# Patient Record
Sex: Male | Born: 1945 | Race: White | Hispanic: No | Marital: Married | Smoking: Current some day smoker
Health system: Southern US, Community
[De-identification: ages and names within clinical notes are randomized; demographics above are authoritative.]

## PROBLEM LIST (undated history)

## (undated) DIAGNOSIS — J449 Chronic obstructive pulmonary disease, unspecified: Secondary | ICD-10-CM

## (undated) DIAGNOSIS — I10 Essential (primary) hypertension: Secondary | ICD-10-CM

## (undated) DIAGNOSIS — I219 Acute myocardial infarction, unspecified: Secondary | ICD-10-CM

## (undated) DIAGNOSIS — E785 Hyperlipidemia, unspecified: Secondary | ICD-10-CM

## (undated) DIAGNOSIS — R55 Syncope and collapse: Secondary | ICD-10-CM

## (undated) DIAGNOSIS — I872 Venous insufficiency (chronic) (peripheral): Secondary | ICD-10-CM

## (undated) DIAGNOSIS — I7090 Unspecified atherosclerosis: Secondary | ICD-10-CM

## (undated) DIAGNOSIS — C801 Malignant (primary) neoplasm, unspecified: Secondary | ICD-10-CM

## (undated) DIAGNOSIS — E039 Hypothyroidism, unspecified: Secondary | ICD-10-CM

## (undated) DIAGNOSIS — I509 Heart failure, unspecified: Secondary | ICD-10-CM

## (undated) DIAGNOSIS — I779 Disorder of arteries and arterioles, unspecified: Secondary | ICD-10-CM

## (undated) DIAGNOSIS — I739 Peripheral vascular disease, unspecified: Secondary | ICD-10-CM

## (undated) HISTORY — PX: CARPAL TUNNEL RELEASE: SHX101

## (undated) HISTORY — PX: CAROTID ENDARTERECTOMY: SUR193

## (undated) HISTORY — PX: APPENDECTOMY: SHX54

## (undated) HISTORY — PX: OTHER SURGICAL HISTORY: SHX169

---

## 2004-08-01 ENCOUNTER — Ambulatory Visit: Payer: Self-pay | Admitting: Internal Medicine

## 2006-05-17 ENCOUNTER — Ambulatory Visit: Payer: Self-pay | Admitting: Internal Medicine

## 2007-05-22 ENCOUNTER — Ambulatory Visit: Payer: Self-pay | Admitting: Internal Medicine

## 2007-10-11 ENCOUNTER — Other Ambulatory Visit: Payer: Self-pay

## 2007-10-11 ENCOUNTER — Emergency Department: Payer: Self-pay | Admitting: Emergency Medicine

## 2007-10-13 ENCOUNTER — Other Ambulatory Visit: Payer: Self-pay

## 2007-10-13 ENCOUNTER — Inpatient Hospital Stay: Payer: Self-pay | Admitting: Internal Medicine

## 2009-11-14 ENCOUNTER — Ambulatory Visit: Payer: Self-pay | Admitting: Internal Medicine

## 2010-08-07 ENCOUNTER — Ambulatory Visit: Payer: Self-pay | Admitting: Vascular Surgery

## 2013-06-23 ENCOUNTER — Ambulatory Visit: Payer: Self-pay | Admitting: Orthopedic Surgery

## 2013-08-10 ENCOUNTER — Ambulatory Visit: Payer: Self-pay | Admitting: Orthopedic Surgery

## 2013-08-10 LAB — CBC WITH DIFFERENTIAL/PLATELET
BASOS ABS: 0 10*3/uL (ref 0.0–0.1)
Basophil %: 0.5 %
Eosinophil #: 0.1 10*3/uL (ref 0.0–0.7)
Eosinophil %: 3.1 %
HCT: 41 % (ref 40.0–52.0)
HGB: 13.9 g/dL (ref 13.0–18.0)
Lymphocyte #: 0.8 10*3/uL — ABNORMAL LOW (ref 1.0–3.6)
Lymphocyte %: 17.9 %
MCH: 29.4 pg (ref 26.0–34.0)
MCHC: 34 g/dL (ref 32.0–36.0)
MCV: 86 fL (ref 80–100)
MONOS PCT: 16.6 %
Monocyte #: 0.8 x10 3/mm (ref 0.2–1.0)
Neutrophil #: 2.8 10*3/uL (ref 1.4–6.5)
Neutrophil %: 61.9 %
Platelet: 176 10*3/uL (ref 150–440)
RBC: 4.74 10*6/uL (ref 4.40–5.90)
RDW: 14.2 % (ref 11.5–14.5)
WBC: 4.6 10*3/uL (ref 3.8–10.6)

## 2013-08-20 ENCOUNTER — Ambulatory Visit: Payer: Self-pay | Admitting: Orthopedic Surgery

## 2014-01-29 ENCOUNTER — Ambulatory Visit: Payer: Self-pay | Admitting: Vascular Surgery

## 2014-02-03 ENCOUNTER — Ambulatory Visit: Payer: Self-pay | Admitting: Vascular Surgery

## 2014-02-03 LAB — CBC
HCT: 40.9 % (ref 40.0–52.0)
HGB: 13.2 g/dL (ref 13.0–18.0)
MCH: 28.6 pg (ref 26.0–34.0)
MCHC: 32.2 g/dL (ref 32.0–36.0)
MCV: 89 fL (ref 80–100)
Platelet: 159 10*3/uL (ref 150–440)
RBC: 4.62 10*6/uL (ref 4.40–5.90)
RDW: 14.5 % (ref 11.5–14.5)
WBC: 4.4 10*3/uL (ref 3.8–10.6)

## 2014-02-03 LAB — PROTIME-INR
INR: 1
Prothrombin Time: 12.9 secs (ref 11.5–14.7)

## 2014-02-03 LAB — APTT: ACTIVATED PTT: 31.9 s (ref 23.6–35.9)

## 2014-02-03 LAB — BASIC METABOLIC PANEL
Anion Gap: 6 — ABNORMAL LOW (ref 7–16)
BUN: 19 mg/dL — AB (ref 7–18)
CALCIUM: 8.8 mg/dL (ref 8.5–10.1)
Chloride: 101 mmol/L (ref 98–107)
Co2: 32 mmol/L (ref 21–32)
Creatinine: 1.32 mg/dL — ABNORMAL HIGH (ref 0.60–1.30)
EGFR (Non-African Amer.): 57 — ABNORMAL LOW
GLUCOSE: 90 mg/dL (ref 65–99)
Osmolality: 279 (ref 275–301)
Potassium: 4.1 mmol/L (ref 3.5–5.1)
SODIUM: 139 mmol/L (ref 136–145)

## 2014-02-05 ENCOUNTER — Inpatient Hospital Stay: Payer: Self-pay | Admitting: Vascular Surgery

## 2014-02-06 LAB — BASIC METABOLIC PANEL
Anion Gap: 9 (ref 7–16)
BUN: 22 mg/dL — AB (ref 7–18)
CALCIUM: 7.8 mg/dL — AB (ref 8.5–10.1)
CO2: 27 mmol/L (ref 21–32)
Chloride: 99 mmol/L (ref 98–107)
Creatinine: 1.12 mg/dL (ref 0.60–1.30)
EGFR (African American): >60
EGFR (Non-African Amer.): >60
Glucose: 111 mg/dL — ABNORMAL HIGH (ref 65–99)
Osmolality: 274 (ref 275–301)
Potassium: 4.1 mmol/L (ref 3.5–5.1)
Sodium: 135 mmol/L — ABNORMAL LOW (ref 136–145)

## 2014-02-06 LAB — CBC WITH DIFFERENTIAL/PLATELET
Basophil #: 0 10*3/uL (ref 0.0–0.1)
Basophil %: 0.5 %
Eosinophil #: 0 10*3/uL (ref 0.0–0.7)
Eosinophil %: 0 %
HCT: 33.4 % — AB (ref 40.0–52.0)
HGB: 11.2 g/dL — ABNORMAL LOW (ref 13.0–18.0)
Lymphocyte #: 0.6 10*3/uL — ABNORMAL LOW (ref 1.0–3.6)
Lymphocyte %: 9.9 %
MCH: 29.2 pg (ref 26.0–34.0)
MCHC: 33.6 g/dL (ref 32.0–36.0)
MCV: 87 fL (ref 80–100)
Monocyte #: 0.4 x10 3/mm (ref 0.2–1.0)
Monocyte %: 7.5 %
NEUTROS PCT: 82.1 %
Neutrophil #: 4.7 10*3/uL (ref 1.4–6.5)
Platelet: 126 10*3/uL — ABNORMAL LOW (ref 150–440)
RBC: 3.84 10*6/uL — AB (ref 4.40–5.90)
RDW: 14 % (ref 11.5–14.5)
WBC: 5.7 10*3/uL (ref 3.8–10.6)

## 2014-02-06 LAB — PROTIME-INR
INR: 1
Prothrombin Time: 13.5 secs (ref 11.5–14.7)

## 2014-02-06 LAB — APTT: ACTIVATED PTT: 33.2 s (ref 23.6–35.9)

## 2014-02-08 LAB — PATHOLOGY REPORT

## 2014-03-22 ENCOUNTER — Ambulatory Visit: Payer: Self-pay | Admitting: Vascular Surgery

## 2014-03-22 LAB — POTASSIUM: POTASSIUM: 4 mmol/L (ref 3.5–5.1)

## 2014-03-25 ENCOUNTER — Inpatient Hospital Stay: Payer: Self-pay | Admitting: Vascular Surgery

## 2014-03-26 LAB — BASIC METABOLIC PANEL
Anion Gap: 7 (ref 7–16)
BUN: 24 mg/dL — ABNORMAL HIGH (ref 7–18)
Calcium, Total: 8.1 mg/dL — ABNORMAL LOW (ref 8.5–10.1)
Chloride: 102 mmol/L (ref 98–107)
Co2: 30 mmol/L (ref 21–32)
Creatinine: 1.3 mg/dL (ref 0.60–1.30)
EGFR (African American): >60
EGFR (Non-African Amer.): 58 — ABNORMAL LOW
Glucose: 85 mg/dL (ref 65–99)
Osmolality: 281 (ref 275–301)
Potassium: 4.5 mmol/L (ref 3.5–5.1)
Sodium: 139 mmol/L (ref 136–145)

## 2014-03-26 LAB — CBC WITH DIFFERENTIAL/PLATELET
Basophil #: 0 10*3/uL (ref 0.0–0.1)
Basophil %: 0.4 %
EOS PCT: 1.1 %
Eosinophil #: 0.1 10*3/uL (ref 0.0–0.7)
HCT: 37.2 % — ABNORMAL LOW (ref 40.0–52.0)
HGB: 12 g/dL — ABNORMAL LOW (ref 13.0–18.0)
LYMPHS PCT: 10.2 %
Lymphocyte #: 0.5 10*3/uL — ABNORMAL LOW (ref 1.0–3.6)
MCH: 28.7 pg (ref 26.0–34.0)
MCHC: 32.3 g/dL (ref 32.0–36.0)
MCV: 89 fL (ref 80–100)
MONOS PCT: 12.2 %
Monocyte #: 0.6 x10 3/mm (ref 0.2–1.0)
NEUTROS ABS: 3.9 10*3/uL (ref 1.4–6.5)
Neutrophil %: 76.1 %
Platelet: 108 10*3/uL — ABNORMAL LOW (ref 150–440)
RBC: 4.19 10*6/uL — ABNORMAL LOW (ref 4.40–5.90)
RDW: 14.3 % (ref 11.5–14.5)
WBC: 5.2 10*3/uL (ref 3.8–10.6)

## 2014-03-26 LAB — PROTIME-INR
INR: 1.1
PROTHROMBIN TIME: 13.6 s (ref 11.5–14.7)

## 2014-03-26 LAB — APTT: Activated PTT: 35.4 secs (ref 23.6–35.9)

## 2014-08-14 NOTE — Op Note (Signed)
PATIENT NAME:  Aaron Bryan, Aaron Bryan MR#:  [id number] DATE OF BIRTH:  Aug 11, 1945  DATE OF PROCEDURE:  08/20/2013  PREOPERATIVE DIAGNOSIS: Left carpal tunnel syndrome.   POSTOPERATIVE DIAGNOSIS:  Left carpal tunnel syndrome.  PROCEDURE: Left open carpal tunnel release.   SURGEON: Thornton Park, MD  ANESTHESIA: General.   ESTIMATED BLOOD LOSS: Minimal.   COMPLICATIONS: None.  TOURNIQUET TIME: 46 minutes.   INDICATIONS FOR PROCEDURE: [name] is a 70 year old male who has successfully undergone a right open carpal tunnel release. He has had bilateral carpal tunnel syndrome for several months. His pain and numbness have been confirmed as carpal tunnel by a nerve conduction study performed in my office. Given the patient's success with the right hand, he wished to proceed with open carpal tunnel release on the left side. I had reviewed the risks and benefits again with the patient for this procedure. He is aware of I had given, that he has already been through this surgery previously.   PROCEDURE NOTE: The patient was in the preoperative area. I signed the left hand with the word "yes" according to the hospital's right site protocol. He was brought to the operating room where he was placed supine on the operative table. He was prepped and draped in sterile fashion. A timeout was performed to verify the patient's name, date of birth, medical record number, correct site of surgery and correct procedure to be performed. It was also used to verify the patient had received the antibiotics and all appropriate instruments and radiographic studies were available in the room. Once all in attendance were in agreement, the case began.   The patient had his left arm wrapped in an Esmarch. The tourniquet was inflated to 250 mmHg. His surgical incision was drawn out with a surgical marker. This was based on the extension of Kaplan's cardinal line and the extension of the line between the ring and middle finger. I  used the patient's natural creases when creating the incision. A #15 blade was used to create the incision. The subcutaneous tissues were carefully dissected using an Adson pick-up and Metzenbaum scissor. The polar fascia was then identified again carefully dissected. The distal aspect of the transverse carpal ligament was then identified. A Freer elevator was carefully placed underneath the transverse carpal ligament and advanced proximally into the carpal tunnel. A micro Beaver blade was then used to incise the transverse carpal ligament. Loupes were used for this case through assistant visualization. Care was taken to watch for the motor branch of the median nerve which was not encountered during the creation of the incision to the transverse carpal ligament. Once the release of the transverse carpal ligament was extended approximately 1 to 2 cm into the deep fascia of the forearm, as this was also very tight and thought to be contributing to the compression of the median nerve. Care was taken to avoid injury to the sensory branch of the median nerve.   Once the carpal tunnel was released and the median nerve was well-visualized, the wound was copiously irrigated. The incision was closed with 4-0 nylon in an interrupted fashion. Steri-Strips and Xeroform were placed over the incision, along with a dry sterile dressing and a volar wrist splint made out of Orthoglass. The bias cut was placed overlying this dressing. The patient was then awakened and brought to the PACU in stable condition. I was scrubbed and present for the entire case and all sharp and instrument counts were correct at the conclusion of the  case. I spoke with the patient's wife in the postoperative consultation room to let her know the case had gone without complication. The patient was stable in the recovery room.   ____________________________ Timoteo Gaul, MD klk:ce D: 08/24/2013 18:49:47 ET T: 08/24/2013 19:15:52  ET JOB#: [id number]  cc: Timoteo Gaul, MD, <Dictator> Timoteo Gaul MD ELECTRONICALLY SIGNED 08/28/2013 21:11

## 2014-08-14 NOTE — Op Note (Signed)
PATIENT NAME:  Aaron Bryan, Aaron Bryan MR#:  [id number] DATE OF BIRTH:  09/17/1945  DATE OF PROCEDURE:  02/05/2014  PREOPERATIVE DIAGNOSES: 1.  High-grade bilateral carotid artery stenosis with lightheadedness.  2.  Coronary artery disease.  3.  Peripheral arterial disease status post previous intervention.   POSTOPERATIVE DIAGNOSES:  1.  High-grade bilateral carotid artery stenosis with lightheadedness.  2.  Coronary artery disease.  3.  Peripheral arterial disease status post previous intervention.   PROCEDURE: Left carotid endarterectomy with CorMatrix arterial patch reconstruction.   SURGEON: Algernon Huxley, M.D.   ANESTHESIA: General.   BLOOD LOSS: 50 mL.   INDICATION FOR PROCEDURE: A 69 year old gentleman with recent finding of bilateral high-grade carotid artery stenosis. We are planning to repair his left side first as this is his dominant hemisphere and later come back and do the right. Risks and benefits were discussed. Informed consent was obtained.   DESCRIPTION OF PROCEDURE: The patient was brought to the operative suite. After an adequate level of general anesthesia was obtained, he was placed in modified beach chair position. His neck was flexed and turned to the side. His neck was sterilely prepped and draped and a sterile surgical field was created. An incision was created along the anterior border of the sternocleidomastoid dissected through the platysma with electrocautery. I then exposed the facial vein, which was ligated and divided between silk ties exposing the carotid bifurcation. He had a reasonably high bifurcation with extent of disease a couple centimeters beyond the origin of the internal carotid artery. I had to dissect out and free the hypoglossal nerve with ligating or dividing the branches around this to help expose this. I was then able to encircle the distal internal carotid artery, external carotid artery, superior thyroid artery and common carotid artery with vessel  loops. The patient was heparinized with 6000 units of intravenous heparin and control was pulled up on the vessel loops. An anterior wall arteriotomy was created with an 11 blade and extended with Potts scissors. The Pruitt-Inahara shunt was placed first and the internal carotid artery flushed and de-aired, then in the common carotid artery. Approximately 2 minutes elapsed from clamping to shunt placement. An endarterectomy was then performed in typical fashion. The proximal endpoint was cut flush with tenotomy scissors. An eversion endarterectomy was performed on the external carotid artery and a nice feathered endpoint was created with gentle traction on the internal carotid artery. All loose flecks were removed and the vessel was locally heparinized. The CorMatrix extracellular patch was brought onto the field. It was cut and beveled, starting at the distal endpoint with 6-0 Prolene, and this was run medially and laterally one-half the length of the arteriotomy. It was then cut and beveled to an appropriate length to match the arteriotomy. The medial suture line was run and tied together. The lateral suture line was run approximately one-quarter the length of the arteriotomy. The shunt was then removed. The vessel was flushed from the internal, external and common carotid artery and locally heparinized. The suture line was then completed flushing several cardiac cycles up the external carotid artery prior to releasing control. Approximately 3 minutes elapsed from shunt removal to restoration of flow. Three 6-0 Prolene patch sutures were used for hemostasis. Hemostasis was complete with an excellent pulse in the carotid artery. Surgicel and Evicel topical hemostatic agents were placed and hemostasis was achieved. The wound was then closed with three interrupted 3-0 Vicryl sutures in the sternocleidomastoid. The platysma was closed with a  running 3-0 Vicryl, and the skin was closed with 4-0 Monocryl. Dermabond was  placed as dressing. The patient was awakened from anesthesia and taken to the recovery room in stable condition having tolerated the procedure well.   ____________________________ Algernon Huxley, MD jsd:sb D: 02/05/2014 12:05:12 ET T: 02/05/2014 12:17:15 ET JOB#: [id number]  cc: Algernon Huxley, MD, <Dictator> Algernon Huxley MD ELECTRONICALLY SIGNED 02/06/2014 12:39

## 2014-08-14 NOTE — Op Note (Signed)
PATIENT NAME:  Aaron Bryan, Aaron Bryan MR#:  [id number] DATE OF BIRTH:  [birth year]  DATE OF PROCEDURE:  03/25/2014  PREOPERATIVE DIAGNOSES:  1.  High-grade right carotid artery stenosis.  2.  Status post left carotid endarterectomy for high-grade stenosis.  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  4.  Peripheral arterial disease.   POSTOPERATIVE DIAGNOSES: 1.  High-grade right carotid artery stenosis.  2.  Status post left carotid endarterectomy for high-grade stenosis.  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  4.  Peripheral arterial disease.  PROCEDURE:  Right carotid endarterectomy with CorMatrix arterial patch reconstruction.   SURGEON: Leotis Pain, MD   ANESTHESIA: General.   ESTIMATED BLOOD LOSS: 150 mL.  INDICATION FOR PROCEDURE: A 69 year old gentleman who was found A few months ago to have A bilateral high-grade carotid artery stenosis. He has already undergone left carotid endarterectomy and has done well. This was done a little under 2 months ago. He returns for surgery on his right carotid artery. Risks and benefits were discussed. Informed consent was obtained.   DESCRIPTION OF PROCEDURE: The patient is brought to the operative suite and after an adequate level of general anesthesia was obtained, he was placed in modified beach chair position. His head was flexed and turned to the left and a roll was placed under shoulders. His neck and chest were sterilely prepped and draped and a sterile surgical field was created. I then created an incision along the anterior border of the sternocleidomastoid and dissected down through the platysma with electrocautery. A crossing vein was ligated and divided between silk ties. There was not a dominant facial vein but 2 venous branches were ligated and divided between silk ties. This exposed the carotid artery. The bifurcation was a reasonably normal location. However, the disease tracked about 2 cm above the bifurcation and care had to be taken to dissect  out the carotid artery distally, avoiding injury to the hypoglossal nerve. A venous branch up in this area was ligated and divided between silk ties. The internal carotid artery distal to the lesion and common carotid artery, superior thyroid artery and external carotid artery were all encircled with vessel loops. The patient was heparinized with 5000 units of intravenous heparin and this was allowed to circulate for 5 minutes. Control was then pulled up on the vessel loops. An anterior wall arteriotomy was created with an 11 blade and extended with Potts scissors. The Pruitt-Inahara shunt was then placed, first in the internal carotid artery, flushed and de-aired, then the common carotid artery. Approximate time from clamping to restoration of flow was 2 minutes. I then performed endarterectomy in the typical fashion. An eversion endarterectomy was performed on the external carotid artery. The proximal endpoint was cut flush with tenotomy scissors. A nice feathered distal endpoint was created with gentle traction at the distal endpoint. Two 7-0 Prolene were used to tack down the distal endpoint. All loose flecks were removed and the vessel was locally heparinized. I then used a CorMatrix arterial patch to reconstruct the artery. This was cut and beveled and started at the distal endpoint with 6-0 Prolene and run approximately one-half the length of the arteriotomy medially and laterally. It was then cut and beveled to match the arteriotomy proximally and a second 6-0 Prolene was started at the proximal end of the arteriotomy. This was run medially and tied. The lateral suture line was run approximately one-quarter length of the arteriotomy and the shunt was then removed. The vessel was  flushed from the external, internal and common carotid arteries and locally heparinized. I then completed the arteriotomy, flushing through the external carotid artery prior to releasing control several cardiac cycles. Approximately  2 to 3 minutes elapsed from shunt removal to restoration of flow. Four 6-0 Prolene patch sutures were used for hemostasis and hemostasis was achieved. The wound was irrigated with antibiotic-impregnated saline. Surgicel and Evicel were placed. The wound was then closed with 3 interrupted 3-0 Vicryls in the sternocleidomastoid space. The platysma was closed with a running 3-0 Vicryl. The skin was closed with 4-0 Monocryl and Dermabond was placed as a dressing. The patient was extubated and awakened and taken to the recovery room in stable condition, having tolerated the procedure well.   ____________________________ Algernon Huxley, MD jsd:TT D: 03/25/2014 17:44:31 ET T: 03/25/2014 18:53:22 ET JOB#: [id number]  cc: Algernon Huxley, MD, <Dictator> Derinda Late, MD Algernon Huxley MD ELECTRONICALLY SIGNED 04/22/2014 14:13

## 2014-08-14 NOTE — Op Note (Signed)
PATIENT NAME:  Aaron Bryan, Aaron Bryan MR#:  [id number] DATE OF BIRTH:  [birth year]  DATE OF PROCEDURE:  06/23/2013  PREOPERATIVE DIAGNOSIS:  Right carpal tunnel syndrome.  POSTOPERATIVE DIAGNOSIS:  Right carpal tunnel syndrome.   PROCEDURE:  Open carpal tunnel release.   ANESTHESIA:  General.   SURGEON:  Thornton Park, M.D.   ESTIMATED BLOOD LOSS:  Minimal.   COMPLICATIONS:  None.   TOURNIQUET TIME:  38 minutes.   INDICATIONS FOR THE PROCEDURE:  The patient is a 69 year old male with severe pain with associated numbness and weakness of the right hand.  He had a nerve conduction study performed in my office confirming carpal tunnel syndrome.  Given the patient's symptoms, he wished to proceed with open carpal tunnel release.  I had reviewed the risks and benefits of the procedure with the patient and he signed consent.  He signed the consent form.  He understands the risks include bleeding, nerve injury including injury to the recurrent branch of the median nerve, persistent pain, wound healing difficulty, persistent pillar pain in the volar wrist, wrist stiffness, the recurrence of carpal tunnel syndrome and the need for further surgery.  He understood the medical risks include myocardial infarction, stroke, pneumonia, respiratory failure and death.  The patient had received medical clearance prior to the case.   PROCEDURE:  The patient was brought to the Operating Room where he was placed supine on the operative table.  He was prepped and draped in a sterile fashion.  A timeout was performed to verify the patient's name, date of birth, medical record number, correct site of surgery and correct procedure to be performed.  It was also used to verify the patient had received antibiotics and all appropriate instruments, implants, and radiographic studies were available in the room.  Once all in attendance, were in agreement, the case began.   The patient had the proposed incision drawn out with a  surgical marker based upon Kaplin's cardinal line and the line extending proximally from the webspace between the middle and ring fingers.  The patient's Palmer crease was also incorporated into the incision.  The patient's arm was then exsanguinated.  The tourniquet was elevated to 250 mmHg and inflated for a total of 30 minutes.  The skin incision was then made with a 15 blade.  The subcutaneous tissues were carefully dissected with a Metzenbaum scissor and pick-up.  The distal edge of the transverse carpal ligament was then identified.  A Freer elevator was placed under the transverse carpal ligament and advanced carefully proximally under the transverse carpal ligament.  A micro Beaver blade was then used to carefully incise the palmar fascia and the transverse carpal ligament, taking care to watch for the potential intraligamentous recurrent motor branch of the median nerve.  Once the entire transverse ligament was released, the incision was extended by approximately 5 mm to allow for slight release of the forearm fascia which was also causing compression to the median nerve.  The nerve appeared to be healthy and well-vascularized.  The wound was then copiously irrigated.  The skin was approximated with 4-0 nylon.  Dry sterile dressing was then applied including Xeroform over the incision.  The patient had a 3 x 12 volar splint applied to the arm to help to avoid wrist flexion and extension.  This was overwrapped with a bias bandage.  The patient was then awoken and brought to the PACU in stable condition.  I was scrubbed and present for the entire case and  all sharp and instrument counts were correct at the conclusion of the case.  I spoke with the patient's wife in the postoperative consultation room to let her know that her husband was stable in the recovery room and the case had gone without complication.     ____________________________ Timoteo Gaul, MD klk:ea D: 06/24/2013 23:05:56  ET T: 06/24/2013 23:39:48 ET JOB#: [id number]  cc: Timoteo Gaul, MD, <Dictator> Timoteo Gaul MD ELECTRONICALLY SIGNED 06/25/2013 22:54

## 2014-08-16 LAB — SURGICAL PATHOLOGY

## 2014-11-09 ENCOUNTER — Other Ambulatory Visit: Payer: Self-pay | Admitting: Internal Medicine

## 2014-11-09 DIAGNOSIS — Z720 Tobacco use: Secondary | ICD-10-CM

## 2014-11-09 DIAGNOSIS — R634 Abnormal weight loss: Secondary | ICD-10-CM

## 2014-11-09 DIAGNOSIS — C493 Malignant neoplasm of connective and soft tissue of thorax: Secondary | ICD-10-CM

## 2014-11-16 ENCOUNTER — Ambulatory Visit
Admission: RE | Admit: 2014-11-16 | Discharge: 2014-11-16 | Disposition: A | Discharge status: Home / Self Care | Payer: Medicare Other | Source: Ambulatory Visit | Attending: Internal Medicine | Admitting: Internal Medicine

## 2014-11-16 DIAGNOSIS — Z720 Tobacco use: Secondary | ICD-10-CM | POA: Insufficient documentation

## 2014-11-16 DIAGNOSIS — M4804 Spinal stenosis, thoracic region: Secondary | ICD-10-CM | POA: Diagnosis not present

## 2014-11-16 DIAGNOSIS — R911 Solitary pulmonary nodule: Secondary | ICD-10-CM | POA: Insufficient documentation

## 2014-11-16 DIAGNOSIS — C493 Malignant neoplasm of connective and soft tissue of thorax: Secondary | ICD-10-CM

## 2014-11-16 DIAGNOSIS — R590 Localized enlarged lymph nodes: Secondary | ICD-10-CM | POA: Diagnosis not present

## 2014-11-16 DIAGNOSIS — R634 Abnormal weight loss: Secondary | ICD-10-CM

## 2014-11-16 DIAGNOSIS — Z85831 Personal history of malignant neoplasm of soft tissue: Secondary | ICD-10-CM | POA: Insufficient documentation

## 2014-11-16 HISTORY — DX: Essential (primary) hypertension: I10

## 2014-11-16 HISTORY — DX: Malignant (primary) neoplasm, unspecified: C80.1

## 2014-11-16 MED ORDER — IOHEXOL 300 MG/ML  SOLN
75.0000 mL | Freq: Once | INTRAMUSCULAR | Status: AC | PRN
Start: 2014-11-16 — End: 2014-11-16
  Administered 2014-11-16: 75 mL via INTRAVENOUS

## 2014-11-18 ENCOUNTER — Other Ambulatory Visit: Payer: Self-pay | Admitting: Internal Medicine

## 2014-11-18 DIAGNOSIS — R9389 Abnormal findings on diagnostic imaging of other specified body structures: Secondary | ICD-10-CM

## 2014-11-23 ENCOUNTER — Ambulatory Visit
Admission: RE | Admit: 2014-11-23 | Discharge: 2014-11-23 | Disposition: A | Discharge status: Home / Self Care | Payer: Medicare Other | Source: Ambulatory Visit | Attending: Internal Medicine | Admitting: Internal Medicine

## 2014-11-23 DIAGNOSIS — I708 Atherosclerosis of other arteries: Secondary | ICD-10-CM | POA: Diagnosis not present

## 2014-11-23 DIAGNOSIS — R938 Abnormal findings on diagnostic imaging of other specified body structures: Secondary | ICD-10-CM | POA: Diagnosis present

## 2014-11-23 DIAGNOSIS — I714 Abdominal aortic aneurysm, without rupture: Secondary | ICD-10-CM | POA: Diagnosis not present

## 2014-11-23 DIAGNOSIS — R9389 Abnormal findings on diagnostic imaging of other specified body structures: Secondary | ICD-10-CM

## 2014-11-23 MED ORDER — IOHEXOL 300 MG/ML  SOLN
100.0000 mL | Freq: Once | INTRAMUSCULAR | Status: AC | PRN
  Administered 2014-11-23: 100 mL via INTRAVENOUS

## 2014-11-25 ENCOUNTER — Other Ambulatory Visit: Payer: Self-pay | Admitting: Internal Medicine

## 2014-11-25 DIAGNOSIS — R590 Localized enlarged lymph nodes: Secondary | ICD-10-CM

## 2015-03-31 ENCOUNTER — Ambulatory Visit
Admission: RE | Admit: 2015-03-31 | Discharge: 2015-03-31 | Disposition: A | Discharge status: Home / Self Care | Payer: Medicare Other | Source: Ambulatory Visit | Attending: Internal Medicine | Admitting: Internal Medicine

## 2015-03-31 DIAGNOSIS — R590 Localized enlarged lymph nodes: Secondary | ICD-10-CM

## 2015-03-31 DIAGNOSIS — Z9889 Other specified postprocedural states: Secondary | ICD-10-CM | POA: Diagnosis not present

## 2015-03-31 DIAGNOSIS — R599 Enlarged lymph nodes, unspecified: Secondary | ICD-10-CM | POA: Insufficient documentation

## 2015-03-31 DIAGNOSIS — I714 Abdominal aortic aneurysm, without rupture: Secondary | ICD-10-CM | POA: Diagnosis not present

## 2015-03-31 HISTORY — DX: Heart failure, unspecified: I50.9

## 2015-03-31 MED ORDER — IOHEXOL 300 MG/ML  SOLN
100.0000 mL | Freq: Once | INTRAMUSCULAR | Status: AC | PRN
  Administered 2015-03-31: 100 mL via INTRAVENOUS

## 2015-04-20 ENCOUNTER — Other Ambulatory Visit: Payer: Self-pay | Admitting: Internal Medicine

## 2015-04-20 DIAGNOSIS — R270 Ataxia, unspecified: Secondary | ICD-10-CM

## 2015-04-22 ENCOUNTER — Other Ambulatory Visit: Payer: Self-pay | Admitting: Internal Medicine

## 2015-04-22 DIAGNOSIS — R1310 Dysphagia, unspecified: Secondary | ICD-10-CM

## 2015-04-26 ENCOUNTER — Emergency Department: Payer: Medicare Other

## 2015-04-26 ENCOUNTER — Ambulatory Visit: Admission: RE | Admit: 2015-04-26 | Payer: Medicare Other | Source: Ambulatory Visit

## 2015-04-26 ENCOUNTER — Encounter: Payer: Self-pay | Admitting: Emergency Medicine

## 2015-04-26 ENCOUNTER — Other Ambulatory Visit: Payer: Self-pay

## 2015-04-26 ENCOUNTER — Observation Stay
Admission: EM | Admit: 2015-04-26 | Discharge: 2015-04-28 | Disposition: A | Discharge status: Home / Self Care | Payer: Medicare Other | Attending: Internal Medicine | Admitting: Internal Medicine

## 2015-04-26 DIAGNOSIS — R05 Cough: Secondary | ICD-10-CM | POA: Diagnosis not present

## 2015-04-26 DIAGNOSIS — I350 Nonrheumatic aortic (valve) stenosis: Secondary | ICD-10-CM | POA: Diagnosis not present

## 2015-04-26 DIAGNOSIS — R531 Weakness: Secondary | ICD-10-CM | POA: Insufficient documentation

## 2015-04-26 DIAGNOSIS — R7989 Other specified abnormal findings of blood chemistry: Secondary | ICD-10-CM | POA: Diagnosis present

## 2015-04-26 DIAGNOSIS — I509 Heart failure, unspecified: Secondary | ICD-10-CM

## 2015-04-26 DIAGNOSIS — I2489 Other forms of acute ischemic heart disease: Secondary | ICD-10-CM | POA: Diagnosis present

## 2015-04-26 DIAGNOSIS — F17210 Nicotine dependence, cigarettes, uncomplicated: Secondary | ICD-10-CM | POA: Insufficient documentation

## 2015-04-26 DIAGNOSIS — Z79899 Other long term (current) drug therapy: Secondary | ICD-10-CM | POA: Diagnosis not present

## 2015-04-26 DIAGNOSIS — R748 Abnormal levels of other serum enzymes: Secondary | ICD-10-CM | POA: Diagnosis not present

## 2015-04-26 DIAGNOSIS — I248 Other forms of acute ischemic heart disease: Secondary | ICD-10-CM | POA: Diagnosis present

## 2015-04-26 DIAGNOSIS — Z8249 Family history of ischemic heart disease and other diseases of the circulatory system: Secondary | ICD-10-CM | POA: Insufficient documentation

## 2015-04-26 DIAGNOSIS — E43 Unspecified severe protein-calorie malnutrition: Secondary | ICD-10-CM | POA: Insufficient documentation

## 2015-04-26 DIAGNOSIS — R0602 Shortness of breath: Secondary | ICD-10-CM | POA: Diagnosis not present

## 2015-04-26 DIAGNOSIS — Z7951 Long term (current) use of inhaled steroids: Secondary | ICD-10-CM | POA: Insufficient documentation

## 2015-04-26 DIAGNOSIS — Z923 Personal history of irradiation: Secondary | ICD-10-CM | POA: Diagnosis not present

## 2015-04-26 DIAGNOSIS — C493 Malignant neoplasm of connective and soft tissue of thorax: Secondary | ICD-10-CM | POA: Diagnosis not present

## 2015-04-26 DIAGNOSIS — E039 Hypothyroidism, unspecified: Secondary | ICD-10-CM | POA: Insufficient documentation

## 2015-04-26 DIAGNOSIS — R778 Other specified abnormalities of plasma proteins: Secondary | ICD-10-CM

## 2015-04-26 DIAGNOSIS — J441 Chronic obstructive pulmonary disease with (acute) exacerbation: Secondary | ICD-10-CM | POA: Diagnosis not present

## 2015-04-26 DIAGNOSIS — C3490 Malignant neoplasm of unspecified part of unspecified bronchus or lung: Secondary | ICD-10-CM | POA: Diagnosis not present

## 2015-04-26 DIAGNOSIS — J069 Acute upper respiratory infection, unspecified: Secondary | ICD-10-CM | POA: Insufficient documentation

## 2015-04-26 DIAGNOSIS — R079 Chest pain, unspecified: Secondary | ICD-10-CM | POA: Diagnosis not present

## 2015-04-26 DIAGNOSIS — I10 Essential (primary) hypertension: Secondary | ICD-10-CM | POA: Insufficient documentation

## 2015-04-26 DIAGNOSIS — L8990 Pressure ulcer of unspecified site, unspecified stage: Secondary | ICD-10-CM | POA: Diagnosis present

## 2015-04-26 HISTORY — DX: Chronic obstructive pulmonary disease, unspecified: J44.9

## 2015-04-26 LAB — CARBOXYHEMOGLOBIN
Carboxyhemoglobin: 5.6 % (ref 1.5–9.0)
Methemoglobin: 0.8 %
O2 SAT: 89 %

## 2015-04-26 LAB — BRAIN NATRIURETIC PEPTIDE: B NATRIURETIC PEPTIDE 5: 2535 pg/mL — AB (ref 0.0–100.0)

## 2015-04-26 LAB — COMPREHENSIVE METABOLIC PANEL
ALT: 23 U/L (ref 17–63)
AST: 30 U/L (ref 15–41)
Albumin: 3.6 g/dL (ref 3.5–5.0)
Alkaline Phosphatase: 74 U/L (ref 38–126)
Anion gap: 5 (ref 5–15)
BUN: 39 mg/dL — AB (ref 6–20)
CALCIUM: 9.4 mg/dL (ref 8.9–10.3)
CHLORIDE: 95 mmol/L — AB (ref 101–111)
CO2: 34 mmol/L — AB (ref 22–32)
CREATININE: 1.26 mg/dL — AB (ref 0.61–1.24)
GFR, EST NON AFRICAN AMERICAN: 56 mL/min — AB (ref >60)
Glucose, Bld: 84 mg/dL (ref 65–99)
Potassium: 4.3 mmol/L (ref 3.5–5.1)
Sodium: 134 mmol/L — ABNORMAL LOW (ref 135–145)
Total Bilirubin: 1.7 mg/dL — ABNORMAL HIGH (ref 0.3–1.2)
Total Protein: 7.2 g/dL (ref 6.5–8.1)

## 2015-04-26 LAB — CBC WITH DIFFERENTIAL/PLATELET
Basophils Absolute: 0 10*3/uL (ref 0–0.1)
EOS ABS: 0 10*3/uL (ref 0–0.7)
HCT: 52.4 % — ABNORMAL HIGH (ref 40.0–52.0)
Hemoglobin: 17.2 g/dL (ref 13.0–18.0)
LYMPHS ABS: 0.4 10*3/uL — AB (ref 1.0–3.6)
Lymphocytes Relative: 10 %
MCH: 29.1 pg (ref 26.0–34.0)
MCHC: 32.8 g/dL (ref 32.0–36.0)
MCV: 88.8 fL (ref 80.0–100.0)
Monocytes Absolute: 0.5 10*3/uL (ref 0.2–1.0)
Neutro Abs: 3.4 10*3/uL (ref 1.4–6.5)
Neutrophils Relative %: 79 %
PLATELETS: 84 10*3/uL — AB (ref 150–440)
RBC: 5.9 MIL/uL (ref 4.40–5.90)
RDW: 15.2 % — AB (ref 11.5–14.5)
WBC: 4.3 10*3/uL (ref 3.8–10.6)

## 2015-04-26 LAB — BLOOD GAS, ARTERIAL
ALLENS TEST (PASS/FAIL): POSITIVE — AB
Acid-Base Excess: 4.8 mmol/L — ABNORMAL HIGH (ref 0.0–3.0)
BICARBONATE: 30.4 meq/L — AB (ref 21.0–28.0)
FIO2: 21
O2 Saturation: 87.4 %
PATIENT TEMPERATURE: 37
PO2 ART: 53 mmHg — AB (ref 83.0–108.0)
pCO2 arterial: 48 mmHg (ref 32.0–48.0)
pH, Arterial: 7.41 (ref 7.350–7.450)

## 2015-04-26 LAB — TROPONIN I: TROPONIN I: 0.09 ng/mL — AB (ref <0.031)

## 2015-04-26 MED ORDER — OXYCODONE HCL 5 MG PO TABS: 5.0000 mg | ORAL_TABLET | ORAL | Status: DC | PRN

## 2015-04-26 MED ORDER — SODIUM CHLORIDE 0.9 % IJ SOLN
3.0000 mL | Freq: Two times a day (BID) | INTRAMUSCULAR | Status: DC
  Administered 2015-04-27 – 2015-04-28 (×4): 3 mL via INTRAVENOUS

## 2015-04-26 MED ORDER — ASPIRIN EC 325 MG PO TBEC: 325.0000 mg | DELAYED_RELEASE_TABLET | Freq: Once | ORAL | Status: AC

## 2015-04-26 MED ORDER — MOMETASONE FURO-FORMOTEROL FUM 200-5 MCG/ACT IN AERO
2.0000 | INHALATION_SPRAY | Freq: Two times a day (BID) | RESPIRATORY_TRACT | Status: DC
  Administered 2015-04-27 – 2015-04-28 (×3): 2 via RESPIRATORY_TRACT
  Filled 2015-04-26: qty 8.8

## 2015-04-26 MED ORDER — DEXTROSE 5 % IV SOLN
500.0000 mg | INTRAVENOUS | Status: DC
  Administered 2015-04-27 (×2): 500 mg via INTRAVENOUS
  Filled 2015-04-26 (×3): qty 500

## 2015-04-26 MED ORDER — MORPHINE SULFATE (PF) 2 MG/ML IV SOLN: 2.0000 mg | INTRAVENOUS | Status: DC | PRN

## 2015-04-26 MED ORDER — ACETAMINOPHEN 650 MG RE SUPP: 650.0000 mg | Freq: Four times a day (QID) | RECTAL | Status: DC | PRN

## 2015-04-26 MED ORDER — NITROGLYCERIN 2 % TD OINT
TOPICAL_OINTMENT | TRANSDERMAL | Status: AC
  Filled 2015-04-26: qty 1

## 2015-04-26 MED ORDER — AMLODIPINE BESYLATE 5 MG PO TABS
2.5000 mg | ORAL_TABLET | Freq: Every day | ORAL | Status: DC
  Administered 2015-04-27 – 2015-04-28 (×2): 2.5 mg via ORAL
  Filled 2015-04-26 (×2): qty 1

## 2015-04-26 MED ORDER — ATENOLOL 25 MG PO TABS
25.0000 mg | ORAL_TABLET | Freq: Two times a day (BID) | ORAL | Status: DC
  Administered 2015-04-27 – 2015-04-28 (×4): 25 mg via ORAL
  Filled 2015-04-26 (×4): qty 1

## 2015-04-26 MED ORDER — ONDANSETRON HCL 4 MG/2ML IJ SOLN: 4.0000 mg | Freq: Four times a day (QID) | INTRAMUSCULAR | Status: DC | PRN

## 2015-04-26 MED ORDER — ONDANSETRON HCL 4 MG PO TABS: 4.0000 mg | ORAL_TABLET | Freq: Four times a day (QID) | ORAL | Status: DC | PRN

## 2015-04-26 MED ORDER — IPRATROPIUM-ALBUTEROL 0.5-2.5 (3) MG/3ML IN SOLN: 3.0000 mL | RESPIRATORY_TRACT | Status: DC | PRN

## 2015-04-26 MED ORDER — BUPROPION HCL ER (SR) 150 MG PO TB12
150.0000 mg | ORAL_TABLET | Freq: Two times a day (BID) | ORAL | Status: DC
  Administered 2015-04-27 – 2015-04-28 (×4): 150 mg via ORAL
  Filled 2015-04-26 (×4): qty 1

## 2015-04-26 MED ORDER — HEPARIN SODIUM (PORCINE) 5000 UNIT/ML IJ SOLN
5000.0000 [IU] | Freq: Three times a day (TID) | INTRAMUSCULAR | Status: DC
  Administered 2015-04-27 – 2015-04-28 (×5): 5000 [IU] via SUBCUTANEOUS
  Filled 2015-04-26 (×5): qty 1

## 2015-04-26 MED ORDER — ACETAMINOPHEN 325 MG PO TABS: 650.0000 mg | ORAL_TABLET | Freq: Four times a day (QID) | ORAL | Status: DC | PRN

## 2015-04-26 MED ORDER — ASPIRIN 325 MG PO TABS
ORAL_TABLET | ORAL | Status: AC
  Administered 2015-04-26: 325 mg
  Filled 2015-04-26: qty 1

## 2015-04-26 MED ORDER — SERTRALINE HCL 100 MG PO TABS
100.0000 mg | ORAL_TABLET | Freq: Every day | ORAL | Status: DC
  Administered 2015-04-27 – 2015-04-28 (×2): 100 mg via ORAL
  Filled 2015-04-26 (×2): qty 1

## 2015-04-26 MED ORDER — NITROGLYCERIN 2 % TD OINT
1.0000 [in_us] | TOPICAL_OINTMENT | Freq: Once | TRANSDERMAL | Status: AC
  Administered 2015-04-26: 1 [in_us] via TOPICAL

## 2015-04-26 MED ORDER — METHYLPREDNISOLONE SODIUM SUCC 125 MG IJ SOLR
60.0000 mg | INTRAMUSCULAR | Status: DC
  Administered 2015-04-27: 60 mg via INTRAVENOUS
  Filled 2015-04-26: qty 2

## 2015-04-26 MED ORDER — LEVOTHYROXINE SODIUM 25 MCG PO TABS
25.0000 ug | ORAL_TABLET | Freq: Every day | ORAL | Status: DC
  Administered 2015-04-27 – 2015-04-28 (×2): 25 ug via ORAL
  Filled 2015-04-26 (×2): qty 1

## 2015-04-26 NOTE — ED Provider Notes (Signed)
[facility] Emergency Department Provider Note  ____________________________________________  Time seen: Approximately 5:35 PM  I have reviewed the triage vital signs and the nursing notes.   HISTORY  Chief Complaint Cough    HPI Aaron Boomer Sr. is a 70 y.o. male Sent over from [place] [facility] because he was blue. Patient himself reports he's been getting blue for last few months when he gets cold. He says if he gets warm and tender heating blanket etc. His fingers and ears etc. Get nice pink like there is post to be. He says he's been having a bad cough for quite some time months again. Had a CT scan that was not showing much of anything a few months ago scheduled to have another one on Monday. He does have a very bad sounding cough. She has a defect of multiple ribs in the right upper chest due to his surgery for his soft tissue sarcoma. Patient reports his cough is productive of thick clear phlegm on occasion.   Past Medical History  Diagnosis Date  . Hypertension   . 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 CHF (congestive heart failure) ([place])   . COPD (chronic obstructive pulmonary disease) [facility])     Patient Active Problem List   Diagnosis Date Noted  . Elevated troponin 04/26/2015  . COPD exacerbation ([place]) 04/26/2015    History reviewed. No pertinent past surgical history.  No current outpatient prescriptions on file.  Allergies Review of patient's allergies indicates no known allergies.  Family History  Problem Relation Age of Onset  . Hypertension Other     Social History Social History  Substance Use Topics  . Smoking status: Current Some Day Smoker    Types: Cigarettes  . Smokeless tobacco: None  . Alcohol Use: Yes     Comment: beer occas.     Review of Systems Constitutional: No fever/chills Eyes: No visual changes. ENT: No sore throat. Cardiovascular: Denies chest pain. Respiratory: Denies  shortness of breath. Gastrointestinal: No abdominal pain.  No nausea, no vomiting.  No diarrhea.  No constipation. Genitourinary: Negative for dysuria. Musculoskeletal: Negative for back pain. Skin: Negative for rash. Neurological: Negative for headaches, focal weakness or numbness.  10-point ROS otherwise negative.  ____________________________________________   PHYSICAL EXAM:  VITAL SIGNS: ED Triage Vitals  Enc Vitals Group     BP 04/26/15 1651 114/61 mmHg     Pulse Rate 04/26/15 1651 71     Resp 04/26/15 1651 18     Temp 04/26/15 1651 97.4 F (36.3 C)     Temp Source 04/26/15 1651 Axillary     SpO2 --      Weight 04/26/15 1651 135 lb (61.236 kg)     Height 04/26/15 1651 '5\' 9"'$  (1.753 m)     Head Cir --      Peak Flow --      Pain Score --      Pain Loc --      Pain Edu? --      Excl. in [place]? --     Constitutional: Alert and oriented. Well appearing and in no acute distress. Eyes: Conjunctivae are normal. PERRL. EOMI. Head: Atraumatic. Nose: No congestion/rhinnorhea. Mouth/Throat: Mucous membranes are moist.  Oropharynx non-erythematous. Neck: No stridor.   Cardiovascular: Normal rate, regular rhythm. Grossly normal heart sounds.  Good peripheral circulation. Respiratory: Normal respiratory effort. Chest wall defect has marked retractions otherwise are no retractions. Lungs CTAB.  Gastrointestinal: Soft and nontender. No distention. No abdominal bruits. No CVA tenderness. Musculoskeletal: No lower extremity tenderness nor edema.  No joint effusions. Neurologic:  Normal speech and language. No gross focal neurologic deficits are appreciated. No gait instability. Skin:  Skin is warm, dry and intact. No rash noted. Psychiatric: Mood and affect are normal. Speech and behavior are normal.  ____________________________________________   LABS (all labs ordered are listed, but only abnormal results are displayed)  Labs Reviewed  BLOOD GAS, ARTERIAL - Abnormal; Notable for  the following:    pO2, Arterial 53 (*)    Bicarbonate 30.4 (*)    Acid-Base Excess 4.8 (*)    Allens test (pass/fail) POSITIVE (*)    All other components within normal limits  COMPREHENSIVE METABOLIC PANEL - Abnormal; Notable for the following:    Sodium 134 (*)    Chloride 95 (*)    CO2 34 (*)    BUN 39 (*)    Creatinine, Ser 1.26 (*)    Total Bilirubin 1.7 (*)    GFR calc non Af Amer 56 (*)    All other components within normal limits  CBC WITH DIFFERENTIAL/PLATELET - Abnormal; Notable for the following:    HCT 52.4 (*)    RDW 15.2 (*)    Platelets 84 (*)    Lymphs Abs 0.4 (*)    All other components within normal limits  BRAIN NATRIURETIC PEPTIDE - Abnormal; Notable for the following:    B Natriuretic Peptide 2535.0 (*)    All other components within normal limits  TROPONIN I - Abnormal; Notable for the following:    Troponin I 0.09 (*)    All other components within normal limits  CARBOXYHEMOGLOBIN  TROPONIN I  TROPONIN I  TROPONIN I   ____________________________________________  EKG  EKG is not currently available for me on either the computer or on paper I can therefore not interpreted although I remember seeing it and I do not remember any acute changes ____________________________________________  RADIOLOGY  Radiologist cannot be sure of any acute changes on the x-ray and not either ____________________________________________   PROCEDURES    ____________________________________________   INITIAL IMPRESSION / ASSESSMENT AND PLAN / ED COURSE  Pertinent labs & imaging results that were available during my care of the patient were reviewed by me and considered in my medical decision making (see chart for details).   ____________________________________________   FINAL CLINICAL IMPRESSION(S) / ED DIAGNOSES  Final diagnoses:  Elevated troponin  Acute on chronic congestive heart failure, unspecified congestive heart failure type ([place])       Nena Polio, MD 04/27/15 0104

## 2015-04-26 NOTE — ED Notes (Signed)
Upon obtaining sats in triage, sat on earlobe 98%. Pt denies pain, appearance of fingers and ears have a blue-ish tent, however, pt states they are like that when he is cold, they return to normal color when warm.  Dr Cinda Quest to triage to assess pt. Orders obtained.

## 2015-04-26 NOTE — ED Notes (Signed)
Pt was sent over from [facility] after they were unable to obtain O2 sats, was being seen for follow up appointment. Pt has had a cough for about a month now, states he does not feel sob. Pt has copd.

## 2015-04-26 NOTE — H&P (Signed)
[facility] at [facility] NAME: Aaron Bryan    MR#:  [id number]  DATE OF BIRTH:  June 08, 1945   DATE OF ADMISSION:  04/26/2015  PRIMARY CARE PHYSICIAN: [name], MD   REQUESTING/REFERRING PHYSICIAN: Malinda  CHIEF COMPLAINT:   Chief Complaint  Patient presents with  . Cough    HISTORY OF PRESENT ILLNESS:  Aaron Bryan  is a 70 y.o. 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 Patient states he's been having about a week's worth of symptoms presents to his PCP and they had difficulty getting an oxygen saturation sent to Hospital further workup and evaluation. He denies any frank shortness of breath however does attests to some dyspnea on exertion, productive cough of yellowish sputum with associated wheezing and congestion. Workup in emergency department also revealed elevated troponin  PAST MEDICAL HISTORY:   Past Medical History  Diagnosis Date  . Hypertension   . 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 CHF (congestive heart failure) ([place])   . COPD (chronic obstructive pulmonary disease) ([place])     PAST SURGICAL HISTORY:  History reviewed. No pertinent past surgical history.  SOCIAL HISTORY:   Social History  Substance Use Topics  . Smoking status: Current Some Day Smoker    Types: Cigarettes  . Smokeless tobacco: Not on file  . Alcohol Use: Yes     Comment: beer occas.     FAMILY HISTORY:   Family History  Problem Relation Age of Onset  . Hypertension Other     DRUG ALLERGIES:  No Known Allergies  REVIEW OF SYSTEMS:  REVIEW OF SYSTEMS:  CONSTITUTIONAL: Denies fevers, chills, fatigue, weakness.  EYES: Denies blurred vision, double vision, or eye pain.  EARS, NOSE, THROAT: Denies tinnitus, ear pain, hearing  loss.  RESPIRATORY: Positive cough, shortness of breath, wheezing  CARDIOVASCULAR: Denies chest pain, palpitations, edema.  GASTROINTESTINAL: Denies nausea, vomiting, diarrhea, abdominal pain.  GENITOURINARY: Denies dysuria, hematuria.  ENDOCRINE: Denies nocturia or thyroid problems. HEMATOLOGIC AND LYMPHATIC: Denies easy bruising or bleeding.  SKIN: Denies rash or lesions.  MUSCULOSKELETAL: Denies pain in neck, back, shoulder, knees, hips, or further arthritic symptoms.  NEUROLOGIC: Denies paralysis, paresthesias.  PSYCHIATRIC: Denies anxiety or depressive symptoms. Otherwise full review of systems performed by me is negative.   MEDICATIONS AT HOME:   Prior to Admission medications   Medication Sig Start Date End Date Taking? Authorizing Provider  albuterol (PROVENTIL HFA;VENTOLIN HFA) 108 (90 Base) MCG/ACT inhaler Inhale 2 puffs into the lungs every 6 (six) hours as needed for wheezing or shortness of breath.   Yes Historical Provider, MD  amLODipine (NORVASC) 2.5 MG tablet Take 2.5 mg by mouth daily.   Yes Historical Provider, MD  atenolol (TENORMIN) 25 MG tablet Take 25 mg by mouth 2 (two) times daily.   Yes Historical Provider, MD  buPROPion (WELLBUTRIN SR) 150 MG 12 hr tablet Take 150 mg by mouth 2 (two) times daily.   Yes Historical Provider, MD  cefUROXime (CEFTIN) 500 MG tablet Take 500 mg by mouth 2 (two) times daily. 04/20/15 04/30/15 Yes Historical Provider, MD  hydrochlorothiazide (HYDRODIURIL) 12.5 MG tablet Take 12.5 mg by mouth daily.   Yes Historical Provider, MD  levothyroxine (SYNTHROID, LEVOTHROID) 25 MCG tablet Take 25 mcg by mouth daily before breakfast.  Yes Historical Provider, MD  mometasone-formoterol (DULERA) 200-5 MCG/ACT AERO Inhale 2 puffs into the lungs 2 (two) times daily.   Yes Historical Provider, MD  predniSONE (DELTASONE) 10 MG tablet Take 10-40 mg by mouth daily. Pt is to take 4 tablets for three days, 3 tablets for three days, 2 tablets for three days,  and 1 tablet for three days. 04/20/15 05/02/15 Yes Historical Provider, MD  sertraline (ZOLOFT) 100 MG tablet Take 100 mg by mouth daily.   Yes Historical Provider, MD      VITAL SIGNS:  Blood pressure 113/62, pulse 69, temperature 97.4 F (36.3 C), temperature source Axillary, resp. rate 18, height '5\' 9"'$  (1.753 m), weight 135 lb (61.236 kg), SpO2 93 %.  PHYSICAL EXAMINATION:  VITAL SIGNS: Filed Vitals:   04/26/15 2137 04/26/15 2305  BP: 124/63 113/62  Pulse: 75 69  Temp:    Resp: 24 18   GENERAL:69 y.o.male currently in no acute distress.  HEAD: Normocephalic, atraumatic.  EYES: Pupils equal, round, reactive to light. Extraocular muscles intact. No scleral icterus.  MOUTH: Moist mucosal membrane. Dentition intact. No abscess noted.  EAR, NOSE, THROAT: Clear without exudates. No external lesions.  NECK: Supple. No thyromegaly. No nodules. No JVD.  PULMONARY: Diminished breath sounds with coarse rhonchi throughout No use of accessory muscles, Good respiratory effort. good air entry bilaterally CHEST: Nontender to palpation. Right chest wall postsurgical deformity CARDIOVASCULAR: S1 and S2. Regular rate and rhythm. No murmurs, rubs, or gallops. No edema. Pedal pulses 2+ bilaterally.  GASTROINTESTINAL: Soft, nontender, nondistended. No masses. Positive bowel sounds. No hepatosplenomegaly.  MUSCULOSKELETAL: No swelling, clubbing, or edema. Range of motion full in all extremities.  NEUROLOGIC: Cranial nerves II through XII are intact. No gross focal neurological deficits. Sensation intact. Reflexes intact.  SKIN: No ulceration, lesions, rashes, or cyanosis. Skin warm and dry. Turgor intact.  PSYCHIATRIC: Mood, affect within normal limits. The patient is awake, alert and oriented x 3. Insight, judgment intact.    LABORATORY PANEL:   CBC  Recent Labs Lab 04/26/15 1802  WBC 4.3  HGB 17.2  HCT 52.4*  PLT 84*    ------------------------------------------------------------------------------------------------------------------  Chemistries   Recent Labs Lab 04/26/15 1802  NA 134*  K 4.3  CL 95*  CO2 34*  GLUCOSE 84  BUN 39*  CREATININE 1.26*  CALCIUM 9.4  AST 30  ALT 23  ALKPHOS 74  BILITOT 1.7*   ------------------------------------------------------------------------------------------------------------------  Cardiac Enzymes  Recent Labs Lab 04/26/15 1802  TROPONINI 0.09*   ------------------------------------------------------------------------------------------------------------------  RADIOLOGY:  Dg Chest 2 View  04/26/2015  CLINICAL DATA:  Patient states weakness, chest pain, with sob. Patient states he is a smoker , with CHF and LUng CA EXAM: CHEST  2 VIEW COMPARISON:  CT 11/16/2014 FINDINGS: Normal cardiac silhouette noted. There is postsurgical change in the RIGHT hemi thorax with multiple rib resections anteriorly. There is scarring at the RIGHT lung base. No effusion, infiltrate, or pneumothorax. IMPRESSION: 1. Postsurgical change in the RIGHT hemi thorax with thoracotomy and rib resections anteriorly. 2. No clear acute cardiopulmonary findings. Electronically Signed   By: Suzy Bouchard M.D.   On: 04/26/2015 18:10    EKG:   Orders placed or performed during the hospital encounter of 04/26/15  . ED EKG  . ED EKG    IMPRESSION AND PLAN:   70 year old Caucasian gentleman history of COPD, chest wall sarcoma status post radiation presenting with cough shortness of breath  1. Chronic obstructive pulmonary disease exacerbation: Provide DuoNeb treatments q. 4  hours, Solu-Medrol 60 mg IV q. daily, add azithromycin. Continue with home medications.  2. Essential hypertension: Atenolol, Norvasc 3. Hypothyroidism: Synthroid 4. Elevated troponin: Telemetry trend cardiac enzymes 5. Venous thrombosis prophylactic: Heparin subcutaneous      All the records are reviewed  and case discussed with ED provider. Management plans discussed with the patient, family and they are in agreement.  CODE STATUS: Full  TOTAL TIME TAKING CARE OF THIS PATIENT: 35 minutes.    Hower,  Karenann Cai.D on 04/26/2015 at 11:14 PM  Between 7am to 6pm - Pager - [phone]  After 6pm: House Pager: - [facility]  Office  [phone]  CC: Primary care physician; [name], MD

## 2015-04-27 DIAGNOSIS — I248 Other forms of acute ischemic heart disease: Secondary | ICD-10-CM | POA: Diagnosis present

## 2015-04-27 DIAGNOSIS — L8990 Pressure ulcer of unspecified site, unspecified stage: Secondary | ICD-10-CM | POA: Diagnosis present

## 2015-04-27 LAB — TROPONIN I
TROPONIN I: 0.15 ng/mL — AB (ref <0.031)
TROPONIN I: 0.22 ng/mL — AB (ref <0.031)
Troponin I: 0.12 ng/mL — ABNORMAL HIGH
Troponin I: 0.21 ng/mL — ABNORMAL HIGH
Troponin I: 0.17 ng/mL — ABNORMAL HIGH

## 2015-04-27 MED ORDER — METHYLPREDNISOLONE SODIUM SUCC 40 MG IJ SOLR
40.0000 mg | Freq: Two times a day (BID) | INTRAMUSCULAR | Status: DC
  Administered 2015-04-27 – 2015-04-28 (×3): 40 mg via INTRAVENOUS
  Filled 2015-04-27 (×3): qty 1

## 2015-04-27 MED ORDER — ENSURE ENLIVE PO LIQD
237.0000 mL | Freq: Three times a day (TID) | ORAL | Status: DC
  Administered 2015-04-28 (×2): 237 mL via ORAL

## 2015-04-27 NOTE — Care Management Obs Status (Signed)
[facility] NOTIFICATION   Patient Details  Name: Aaron Fukuda Sr. MRN: [id number] Date of Birth: January 12, 1946   Medicare Observation Status Notification Given:  Yes    Katrina Stack, RN 04/27/2015, 1:37 PM

## 2015-04-27 NOTE — Progress Notes (Addendum)
Initial Nutrition Assessment  DOCUMENTATION CODES:   Severe malnutrition in context of chronic illness  INTERVENTION:   Meals and Snacks: Cater to patient preferences; will send am snack of yogurt. RD took peanut butter and crackers for pt to have in room as a snack as well. Recommend liberalizing diet order to regular to optimize nutritional intake. Medical Food Supplement Therapy: will recommend Ensure Enlive po TID, each supplement provides 350 kcal and 20 grams of protein, and will send Magic Cup BID    NUTRITION DIAGNOSIS:   Malnutrition related to chronic illness as evidenced by percent weight loss, energy intake < or equal to 75% for > or equal to 1 month, severe depletion of muscle mass, moderate depletion of body fat.  GOAL:   Patient will meet greater than or equal to 90% of their needs  MONITOR:    (Energy Intake, Electrolyte and renal Profile, Anthropometrics, Digestive system, Skin)  REASON FOR ASSESSMENT:   Malnutrition Screening Tool    ASSESSMENT:   Pt admitted with COPD exacerbation with elevated troponins. Pt with h/o chest wall sarcoma s/p radiation.  Past Medical History  Diagnosis Date  . Hypertension   . 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 CHF (congestive heart failure) ([place])   . COPD (chronic obstructive pulmonary disease) ([place])      Diet Order:  Diet Heart Room service appropriate?: Yes; Fluid consistency:: Thin    Current Nutrition: Pt reports eating some chicken for lunch and reported eating fairly well for breakfast this am.   Food/Nutrition-Related History: Pt reports poor po intake for a while. Pt's family reports usually eating well for breakfast but bites the rest of the day. Pt reports having had ensure in the past, family reports usual intake of 3 per week.    Scheduled Medications:  . amLODipine  2.5 mg Oral Daily  . atenolol  25 mg Oral BID  . azithromycin  500 mg Intravenous Q24H  . buPROPion   150 mg Oral BID  . feeding supplement (ENSURE ENLIVE)  237 mL Oral TID WC  . heparin  5,000 Units Subcutaneous 3 times per day  . levothyroxine  25 mcg Oral QAC breakfast  . methylPREDNISolone (SOLU-MEDROL) injection  40 mg Intravenous Q12H  . mometasone-formoterol  2 puff Inhalation BID  . sertraline  100 mg Oral Daily  . sodium chloride  3 mL Intravenous Q12H     Electrolyte/Renal Profile and Glucose Profile:   Recent Labs Lab 04/26/15 1802  NA 134*  K 4.3  CL 95*  CO2 34*  BUN 39*  CREATININE 1.26*  CALCIUM 9.4  GLUCOSE 84   Protein Profile:   Recent Labs Lab 04/26/15 1802  ALBUMIN 3.6    Gastrointestinal Profile: Last BM:  04/26/2015   Nutrition-Focused Physical Exam Findings: Nutrition-Focused physical exam completed. Findings are moderate fat depletion, severe muscle depletion, and no edema.     Weight Change: Pt reports weight of 230lbs one year ago (43% weight loss in one year). Pt's family reports noticing weight loss drastically dropping since September 2016.   Skin:   (Stage II sacral pressure ulcer)   Height:   Ht Readings from Last 1 Encounters:  04/26/15 '5\' 9"'$  (1.753 m)    Weight:   Wt Readings from Last 1 Encounters:  04/26/15 130 lb 3.2 oz (59.058 kg)    Ideal Body Weight:   72.7kg  BMI:  Body mass index is 19.22 kg/(m^2).  Estimated  Nutritional Needs:   Kcal:  using IBW of 72.7kg, BEE: 1527kcals, TEE: (IF 1.2-1.4)(AF 1.2) 2200-2566kcals  Protein:  80-95g protein (1.1-1.3g/kg)  Fluid:  1818-2133m of fluid (25-326mkg)  EDUCATION NEEDS:   Education needs no appropriate at this time   HIPhiloRD, LDN Pager ([phone]/On-Call Pager ([phone]

## 2015-04-27 NOTE — Progress Notes (Signed)
Dr. Margaretmary Eddy aware of troponin trend - instructed to place cardiology consult. Wife wanting update - MD to call.

## 2015-04-27 NOTE — Progress Notes (Signed)
[facility] NAME: Aaron Bryan    MR#:  [id number]  DATE OF BIRTH:  June 07, 1945  SUBJECTIVE:  CHIEF COMPLAINT:  Patient's breathing is better. Reporting productive cough. No chest tightness or shortness of breath  REVIEW OF SYSTEMS:  CONSTITUTIONAL: No fever, fatigue or weakness.  EYES: No blurred or double vision.  EARS, NOSE, AND THROAT: No tinnitus or ear pain.  RESPIRATORY: Reports productive cough, shortness of breath, denies wheezing or hemoptysis.  CARDIOVASCULAR: No chest pain, orthopnea, edema.  GASTROINTESTINAL: No nausea, vomiting, diarrhea or abdominal pain.  GENITOURINARY: No dysuria, hematuria.  ENDOCRINE: No polyuria, nocturia,  HEMATOLOGY: No anemia, easy bruising or bleeding SKIN: No rash or lesion. MUSCULOSKELETAL: No joint pain or arthritis.   NEUROLOGIC: No tingling, numbness, weakness.  PSYCHIATRY: No anxiety or depression.   DRUG ALLERGIES:  No Known Allergies  VITALS:  Blood pressure 116/55, pulse 62, temperature 98.1 F (36.7 C), temperature source Oral, resp. rate 18, height '5\' 9"'$  (1.753 m), weight 59.058 kg (130 lb 3.2 oz), SpO2 91 %.  PHYSICAL EXAMINATION:  GENERAL:  70 y.o.-year-old patient lying in the bed with no acute distress.  EYES: Pupils equal, round, reactive to light and accommodation. No scleral icterus. Extraocular muscles intact.  HEENT: Head atraumatic, normocephalic. Oropharynx and nasopharynx clear.  NECK:  Supple, no jugular venous distention. No thyroid enlargement, no tenderness.  LUNGS:  Coarse breath sounds bilaterally, end expiratory  Wheezing, no  rales,rhonchi or crepitation. No use of accessory muscles of respiration. S/p rt sternectomy and removal of rt 4 ribs 2/2 tumor  CARDIOVASCULAR: S1, S2 normal. No murmurs, rubs, or gallops.  ABDOMEN: Soft, nontender, nondistended. Bowel sounds present. No organomegaly or mass.  EXTREMITIES: No pedal edema, cyanosis, or  clubbing.  NEUROLOGIC: Cranial nerves II through XII are intact. Muscle strength 5/5 in all extremities. Sensation intact. Gait not checked.  PSYCHIATRIC: The patient is alert and oriented x 3.  SKIN: No obvious rash, lesion, or ulcer.    LABORATORY PANEL:   CBC  Recent Labs Lab 04/26/15 1802  WBC 4.3  HGB 17.2  HCT 52.4*  PLT 84*   ------------------------------------------------------------------------------------------------------------------  Chemistries   Recent Labs Lab 04/26/15 1802  NA 134*  K 4.3  CL 95*  CO2 34*  GLUCOSE 84  BUN 39*  CREATININE 1.26*  CALCIUM 9.4  AST 30  ALT 23  ALKPHOS 74  BILITOT 1.7*   ------------------------------------------------------------------------------------------------------------------  Cardiac Enzymes  Recent Labs Lab 04/27/15 1140  TROPONINI 0.21*   ------------------------------------------------------------------------------------------------------------------  RADIOLOGY:  Dg Chest 2 View  04/26/2015  CLINICAL DATA:  Patient states weakness, chest pain, with sob. Patient states he is a smoker , with CHF and LUng CA EXAM: CHEST  2 VIEW COMPARISON:  CT 11/16/2014 FINDINGS: Normal cardiac silhouette noted. There is postsurgical change in the RIGHT hemi thorax with multiple rib resections anteriorly. There is scarring at the RIGHT lung base. No effusion, infiltrate, or pneumothorax. IMPRESSION: 1. Postsurgical change in the RIGHT hemi thorax with thoracotomy and rib resections anteriorly. 2. No clear acute cardiopulmonary findings. Electronically Signed   By: Suzy Bouchard M.D.   On: 04/26/2015 18:10    EKG:   Orders placed or performed during the hospital encounter of 04/26/15  . ED EKG  . ED EKG    ASSESSMENT AND PLAN:   70 year old Caucasian gentleman history of COPD, chest wall sarcoma status post radiation presenting with cough shortness of breath  1. Chronic obstructive pulmonary  disease  exacerbation: Clinically improving . Continue DuoNeb treatments q. 4 hours, Solu-Medrol 60 mg IV q. daily, and azithromycin  . Continue with home medications.  2. Essential hypertension: Atenolol, Norvasc 3. Hypothyroidism: Synthroid 4. Elevated troponin: probably from demand ischemia, continue telemetry. Troponins are trending up. Cardiac consult is placed. Another 2 sets of cardiac enzymes were ordered.    Venous thrombosis prophylactic: Heparin subcutaneous      All the records are reviewed and case discussed with Care Management/Social Workerr. Management plans discussed with the patient, family and they are in agreement.  CODE STATUS: fc  TOTAL TIME TAKING CARE OF THIS PATIENT: 35 minutes.   POSSIBLE D/C IN 2-3DAYS, DEPENDING ON CLINICAL CONDITION.   Nicholes Mango M.D on 04/27/2015 at 2:28 PM  Between 7am to 6pm - Pager - [phone] After 6pm go to [email or web address] - password [email or web address] [facility]  Office  [phone]  CC: Primary care physician; [name], MD

## 2015-04-28 DIAGNOSIS — E43 Unspecified severe protein-calorie malnutrition: Secondary | ICD-10-CM | POA: Insufficient documentation

## 2015-04-28 MED ORDER — ENSURE ENLIVE PO LIQD: 237.0000 mL | Freq: Three times a day (TID) | ORAL | Status: AC

## 2015-04-28 MED ORDER — CEFUROXIME AXETIL 500 MG PO TABS: 500.0000 mg | ORAL_TABLET | Freq: Two times a day (BID) | ORAL | Status: AC

## 2015-04-28 MED ORDER — PREDNISONE 10 MG (21) PO TBPK: 10.0000 mg | ORAL_TABLET | Freq: Every day | ORAL | Status: DC

## 2015-04-28 NOTE — Discharge Instructions (Signed)
Activity - as tolerated   Diet - aha  F/u with pcp and cardio  in a week

## 2015-04-28 NOTE — Progress Notes (Signed)
Patient to be discharged but discharge not complete. Paged Dr. Margaretmary Eddy x2, no response at this time. Will continue to try to get in touch with physician. Wilnette Kales

## 2015-04-28 NOTE — Discharge Summary (Signed)
[facility] NAME: Aaron Bryan    MR#:  [id number]  DATE OF BIRTH:  1945-06-23  DATE OF ADMISSION:  04/26/2015 ADMITTING PHYSICIAN: Lytle Butte, MD  DATE OF DISCHARGE: 04/27/2014   PRIMARY CARE PHYSICIAN: [name], MD    ADMISSION DIAGNOSIS:  Elevated troponin [R79.89] Acute on chronic congestive heart failure, unspecified congestive heart failure type ([place]) [I50.9]  DISCHARGE DIAGNOSIS:  Active Problems:   COPD exacerbation (HCC)   Pressure ulcer   Demand ischemia (HCC)   Protein-calorie malnutrition, severe   SECONDARY DIAGNOSIS:   Past Medical History  Diagnosis Date  . Hypertension   . 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 CHF (congestive heart failure) ([place])   . COPD (chronic obstructive pulmonary disease) [facility])     HOSPITAL COURSE:  70 year old Caucasian gentleman history of COPD, chest wall sarcoma status post radiation presenting with cough shortness of breath  1. Chronic obstructive pulmonary disease exacerbation:  Clinically improved iwith steroids and nebs , DuoNeb treatments q. 4 hours, Solu-Medrol 60 mg IV q. Daily given and tapered with clinical improvement and given azithromycin . Continue with home medications.  2. Essential hypertension: Atenolol, Norvasc  3. Hypothyroidism: Synthroid  4. Elevated troponin: probably from demand ischemia, . Cardiac consult is placed, recommended medical managemnt  Venous thrombosis prophylactic: Heparin subcutaneous       DISCHARGE CONDITIONS:   fair  CONSULTS OBTAINED:  Treatment Team:  Lytle Butte, MD Teodoro Spray, MD   PROCEDURES none  DRUG ALLERGIES:  No Known Allergies  DISCHARGE MEDICATIONS:   Current Discharge Medication List    START taking these medications   Details  predniSONE (STERAPRED UNI-PAK 21 TAB) 10 MG (21) TBPK tablet Take 1 tablet (10 mg total) by mouth daily. Take 6 tablets by  mouth for 1 day followed by  5 tablets by mouth for 1 day followed by  4 tablets by mouth for 1 day followed by  3 tablets by mouth for 1 day followed by  2 tablets by mouth for 1 day followed by  1 tablet by mouth for a day and stop Qty: 21 tablet, Refills: 0      CONTINUE these medications which have NOT CHANGED   Details  albuterol (PROVENTIL HFA;VENTOLIN HFA) 108 (90 Base) MCG/ACT inhaler Inhale 2 puffs into the lungs every 6 (six) hours as needed for wheezing or shortness of breath.    amLODipine (NORVASC) 2.5 MG tablet Take 2.5 mg by mouth daily.    atenolol (TENORMIN) 25 MG tablet Take 25 mg by mouth 2 (two) times daily.    buPROPion (WELLBUTRIN SR) 150 MG 12 hr tablet Take 150 mg by mouth 2 (two) times daily.    cefUROXime (CEFTIN) 500 MG tablet Take 500 mg by mouth 2 (two) times daily.    hydrochlorothiazide (HYDRODIURIL) 12.5 MG tablet Take 12.5 mg by mouth daily.    levothyroxine (SYNTHROID, LEVOTHROID) 25 MCG tablet Take 25 mcg by mouth daily before breakfast.    mometasone-formoterol (DULERA) 200-5 MCG/ACT AERO Inhale 2 puffs into the lungs 2 (two) times daily.    sertraline (ZOLOFT) 100 MG tablet Take 100 mg by mouth daily.      STOP taking these medications     predniSONE (DELTASONE) 10 MG tablet          DISCHARGE INSTRUCTIONS:    Activity - as tolerated  Diet - aha  F/u with pcp and cardio in a week   DIET:  Cardiac diet  DISCHARGE CONDITION:  Fair  ACTIVITY:  Activity as tolerated  OXYGEN:  Home Oxygen: No.   Oxygen Delivery: room air  DISCHARGE LOCATION:  home   If you experience worsening of your admission symptoms, develop shortness of breath, life threatening emergency, suicidal or homicidal thoughts you must seek medical attention immediately by calling 911 or calling your MD immediately  if symptoms less severe.  You Must read complete instructions/literature along with all the possible adverse reactions/side effects for all the  Medicines you take and that have been prescribed to you. Take any new Medicines after you have completely understood and accpet all the possible adverse reactions/side effects.   Please note  You were cared for by a hospitalist during your hospital stay. If you have any questions about your discharge medications or the care you received while you were in the hospital after you are discharged, you can call the unit and asked to speak with the hospitalist on call if the hospitalist that took care of you is not available. Once you are discharged, your primary care physician will handle any further medical issues. Please note that NO REFILLS for any discharge medications will be authorized once you are discharged, as it is imperative that you return to your primary care physician (or establish a relationship with a primary care physician if you do not have one) for your aftercare needs so that they can reassess your need for medications and monitor your lab values.     Today  Chief Complaint  Patient presents with  . Cough   Pt is doing fine , denies any chest pain or sob   ROS:  CONSTITUTIONAL: Denies fevers, chills. Denies any fatigue, weakness.  EYES: Denies blurry vision, double vision, eye pain. EARS, NOSE, THROAT: Denies tinnitus, ear pain, hearing loss. RESPIRATORY: Denies cough, wheeze, shortness of breath.  CARDIOVASCULAR: Denies chest pain, palpitations, edema.  GASTROINTESTINAL: Denies nausea, vomiting, diarrhea, abdominal pain. Denies bright red blood per rectum. GENITOURINARY: Denies dysuria, hematuria. ENDOCRINE: Denies nocturia or thyroid problems. HEMATOLOGIC AND LYMPHATIC: Denies easy bruising or bleeding. SKIN: Denies rash or lesion. MUSCULOSKELETAL: Denies pain in neck, back, shoulder, knees, hips or arthritic symptoms.  NEUROLOGIC: Denies paralysis, paresthesias.  PSYCHIATRIC: Denies anxiety or depressive symptoms.   VITAL SIGNS:  Blood pressure 113/51, pulse 88,  temperature 97.5 F (36.4 C), temperature source Oral, resp. rate 18, height '5\' 9"'$  (1.753 m), weight 59.058 kg (130 lb 3.2 oz), SpO2 94 %.  I/O:   Intake/Output Summary (Last 24 hours) at 04/28/15 1517 Last data filed at 04/28/15 1130  Gross per 24 hour  Intake      0 ml  Output      0 ml  Net      0 ml    PHYSICAL EXAMINATION:  GENERAL:  70 y.o.-year-old patient lying in the bed with no acute distress.  EYES: Pupils equal, round, reactive to light and accommodation. No scleral icterus. Extraocular muscles intact.  HEENT: Head atraumatic, normocephalic. Oropharynx and nasopharynx clear.  NECK:  Supple, no jugular venous distention. No thyroid enlargement, no tenderness.  LUNGS: Coarse breath sounds bilaterally, no Wheezing, no rales,rhonchi or crepitation. No use of accessory muscles of respiration. S/p rt sternectomy and removal of rt 4 ribs 2/2 tumor   CARDIOVASCULAR: S1, S2 normal. No murmurs, rubs, or gallops.  ABDOMEN: Soft, non-tender, non-distended. Bowel sounds present. No organomegaly or mass.  EXTREMITIES: No  pedal edema, cyanosis, or clubbing.  NEUROLOGIC: Cranial nerves II through XII are intact. Muscle strength 5/5 in all extremities. Sensation intact. Gait not checked.  PSYCHIATRIC: The patient is alert and oriented x 3.  SKIN: No obvious rash, lesion, or ulcer.   DATA REVIEW:   CBC  Recent Labs Lab 04/26/15 1802  WBC 4.3  HGB 17.2  HCT 52.4*  PLT 84*    Chemistries   Recent Labs Lab 04/26/15 1802  NA 134*  K 4.3  CL 95*  CO2 34*  GLUCOSE 84  BUN 39*  CREATININE 1.26*  CALCIUM 9.4  AST 30  ALT 23  ALKPHOS 74  BILITOT 1.7*    Cardiac Enzymes  Recent Labs Lab 04/27/15 2050  TROPONINI 0.17*    Microbiology Results  No results found for this or any previous visit.  RADIOLOGY:  Dg Chest 2 View  04/26/2015  CLINICAL DATA:  Patient states weakness, chest pain, with sob. Patient states he is a smoker , with CHF and LUng CA EXAM: CHEST  2  VIEW COMPARISON:  CT 11/16/2014 FINDINGS: Normal cardiac silhouette noted. There is postsurgical change in the RIGHT hemi thorax with multiple rib resections anteriorly. There is scarring at the RIGHT lung base. No effusion, infiltrate, or pneumothorax. IMPRESSION: 1. Postsurgical change in the RIGHT hemi thorax with thoracotomy and rib resections anteriorly. 2. No clear acute cardiopulmonary findings. Electronically Signed   By: Suzy Bouchard M.D.   On: 04/26/2015 18:10    EKG:   Orders placed or performed during the hospital encounter of 04/26/15  . ED EKG  . ED EKG      Management plans discussed with the patient, family and they are in agreement.  CODE STATUS:     Code Status Orders        Start     Ordered   04/26/15 2255  Full code   Continuous     04/26/15 2255      TOTAL TIME TAKING CARE OF THIS PATIENT: 45  minutes.    '@[facility]'$ @  on 04/28/2015 at 3:17 PM  Between 7am to 6pm - Pager - [phone]  After 6pm go to [email or web address] - password [email or web address] [facility]  Office  [phone]  CC: Primary care physician; [name], MD

## 2015-04-28 NOTE — Consult Note (Signed)
[facility]  CARDIOLOGY CONSULT NOTE  Patient ID: Aaron Cuevas Sr. MRN: [id number] DOB/AGE: Apr 02, 1946 70 y.o.  Admit date: 04/26/2015 Referring Physician Dr. Margaretmary Eddy Primary Physician   Primary Cardiologist Dr. Nehemiah Massed Reason for Consultation Troponin  HPI:  Patient is a 70 year old male with history of hypertension, COPD, mild aortic stenosis treated medically with preserved LV function.  Ejection fraction was greater than 55%.  Patient develop a cough and shortness of breath.  He presented to the emergency room where electrocardiogram revealed normal sinus rhythm with no ischemia.  Chest x-ray revealed no acute cardiopulmonary findings.  His serum troponin was trivially elevated to 0.22.  Patient had pleuritic cough related chest pain but no anginal symptoms.  He has some mild shortness of breath.    ROS Review of Systems - General ROS: positive for  - fatigue and cough and sob Respiratory ROS: positive for - cough and shortness of breath Cardiovascular ROS: positive for - cough related chest pain Gastrointestinal ROS: no abdominal pain, change in bowel habits, or black or bloody stools Musculoskeletal ROS: negative Neurological ROS: no TIA or stroke symptoms   Past Medical History  Diagnosis Date  . Hypertension   . 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 CHF (congestive heart failure) ([place])   . COPD (chronic obstructive pulmonary disease) (HCC)     Family History  Problem Relation Age of Onset  . Hypertension Other     Social History   Social History  . Marital Status: Married    Spouse Name: N/A  . Number of Children: N/A  . Years of Education: N/A   Occupational History  . Not on file.   Social History Main Topics  . Smoking status: Current Some Day Smoker    Types: Cigarettes  . Smokeless tobacco: Not on file  . Alcohol Use: Yes     Comment: beer occas.   . Drug Use: Not on file  .  Sexual Activity: Not on file   Other Topics Concern  . Not on file   Social History Narrative    History reviewed. No pertinent past surgical history.   Prescriptions prior to admission  Medication Sig Dispense Refill Last Dose  . albuterol (PROVENTIL HFA;VENTOLIN HFA) 108 (90 Base) MCG/ACT inhaler Inhale 2 puffs into the lungs every 6 (six) hours as needed for wheezing or shortness of breath.   04/26/2015 at Unknown time  . amLODipine (NORVASC) 2.5 MG tablet Take 2.5 mg by mouth daily.   04/26/2015 at Unknown time  . atenolol (TENORMIN) 25 MG tablet Take 25 mg by mouth 2 (two) times daily.   04/26/2015 at 0900  . buPROPion (WELLBUTRIN SR) 150 MG 12 hr tablet Take 150 mg by mouth 2 (two) times daily.   04/26/2015 at Unknown time  . cefUROXime (CEFTIN) 500 MG tablet Take 500 mg by mouth 2 (two) times daily.   04/26/2015 at Unknown time  . hydrochlorothiazide (HYDRODIURIL) 12.5 MG tablet Take 12.5 mg by mouth daily.   04/26/2015 at Unknown time  . levothyroxine (SYNTHROID, LEVOTHROID) 25 MCG tablet Take 25 mcg by mouth daily before breakfast.   04/26/2015 at Unknown time  . mometasone-formoterol (DULERA) 200-5 MCG/ACT AERO Inhale 2 puffs into the lungs 2 (two) times daily.   04/26/2015 at Unknown time  . predniSONE (DELTASONE) 10 MG tablet Take 10-40 mg by mouth daily. Pt is to take 4 tablets for three days, 3  tablets for three days, 2 tablets for three days, and 1 tablet for three days.   04/26/2015 at Unknown time  . sertraline (ZOLOFT) 100 MG tablet Take 100 mg by mouth daily.   04/26/2015 at Unknown time    Physical Exam: Blood pressure 113/51, pulse 88, temperature 97.5 F (36.4 C), temperature source Oral, resp. rate 18, height '5\' 9"'$  (1.753 m), weight 59.058 kg (130 lb 3.2 oz), SpO2 94 %.    General appearance: alert and cooperative Head: Normocephalic, without obvious abnormality, atraumatic Resp: rhonchi bilaterally Cardio: regular rate and rhythm GI: soft, non-tender; bowel sounds normal; no  masses,  no organomegaly Extremities: extremities normal, atraumatic, no cyanosis or edema Neurologic: Grossly normal Labs:   Lab Results  Component Value Date   WBC 4.3 04/26/2015   HGB 17.2 04/26/2015   HCT 52.4* 04/26/2015   MCV 88.8 04/26/2015   PLT 84* 04/26/2015    Recent Labs Lab 04/26/15 1802  NA 134*  K 4.3  CL 95*  CO2 34*  BUN 39*  CREATININE 1.26*  CALCIUM 9.4  PROT 7.2  BILITOT 1.7*  ALKPHOS 74  ALT 23  AST 30  GLUCOSE 84   Lab Results  Component Value Date   TROPONINI 0.17* 04/27/2015      Radiology:  Chest x-ray revealed no acute cardiopulmonary disease EKG:  Sinus rhythm with no ischemia  ASSESSMENT AND PLAN:   70 year old male with history of emphysema and COPD admitted with upper respiratory infection.  Patient had a mildly elevated serum troponin which is demand ischemia and not a non ST elevation myocardia infarction.  He has no evidence of acute coronary syndrome.  He has improved with treatment of his upper risk for infection.  He has no clinical or x-ray evidence of heart failure.  No significant arrhythmias or ischemia.  Would recommend continue with current regimen from a cardiac standpoint and discharged to home with outpatient follow-up with Dr. Nehemiah Massed. Signed: Teodoro Spray MD, [facility] 04/28/2015, 10:44 AM

## 2015-04-28 NOTE — Progress Notes (Signed)
Discharge instructions discussed with patient and spouse. They are in agreement with plan of care. No complaints. Pt is being escorted via wheelchair to family car. Patient is being discharged to the house.

## 2015-04-29 ENCOUNTER — Ambulatory Visit: Admission: RE | Admit: 2015-04-29 | Payer: Medicare Other | Source: Ambulatory Visit

## 2015-05-02 ENCOUNTER — Ambulatory Visit: Payer: Medicare Other

## 2015-06-07 ENCOUNTER — Other Ambulatory Visit: Payer: Self-pay | Admitting: Vascular Surgery

## 2015-06-08 ENCOUNTER — Other Ambulatory Visit
Admission: RE | Admit: 2015-06-08 | Discharge: 2015-06-08 | Disposition: A | Discharge status: Home / Self Care | Payer: Medicare Other | Source: Ambulatory Visit | Attending: Vascular Surgery | Admitting: Vascular Surgery

## 2015-06-08 DIAGNOSIS — Z0000 Encounter for general adult medical examination without abnormal findings: Secondary | ICD-10-CM | POA: Insufficient documentation

## 2015-06-08 LAB — CREATININE, SERUM
CREATININE: 1.23 mg/dL (ref 0.61–1.24)
GFR calc Af Amer: >60 mL/min (ref >60)
GFR, EST NON AFRICAN AMERICAN: 58 mL/min — AB (ref >60)

## 2015-06-08 LAB — BUN: BUN: 25 mg/dL — AB (ref 6–20)

## 2015-06-09 ENCOUNTER — Encounter
Admission: RE | Disposition: A | Discharge status: Home / Self Care | Payer: Self-pay | Source: Ambulatory Visit | Attending: Vascular Surgery

## 2015-06-09 ENCOUNTER — Encounter: Payer: Self-pay | Admitting: *Deleted

## 2015-06-09 ENCOUNTER — Ambulatory Visit
Admission: RE | Admit: 2015-06-09 | Discharge: 2015-06-09 | Disposition: A | Discharge status: Home / Self Care | Payer: Medicare Other | Source: Ambulatory Visit | Attending: Vascular Surgery | Admitting: Vascular Surgery

## 2015-06-09 DIAGNOSIS — Z859 Personal history of malignant neoplasm, unspecified: Secondary | ICD-10-CM | POA: Insufficient documentation

## 2015-06-09 DIAGNOSIS — I110 Hypertensive heart disease with heart failure: Secondary | ICD-10-CM | POA: Insufficient documentation

## 2015-06-09 DIAGNOSIS — I252 Old myocardial infarction: Secondary | ICD-10-CM | POA: Diagnosis not present

## 2015-06-09 DIAGNOSIS — L97519 Non-pressure chronic ulcer of other part of right foot with unspecified severity: Secondary | ICD-10-CM | POA: Diagnosis not present

## 2015-06-09 DIAGNOSIS — I70235 Atherosclerosis of native arteries of right leg with ulceration of other part of foot: Secondary | ICD-10-CM | POA: Diagnosis present

## 2015-06-09 DIAGNOSIS — E785 Hyperlipidemia, unspecified: Secondary | ICD-10-CM | POA: Insufficient documentation

## 2015-06-09 DIAGNOSIS — I509 Heart failure, unspecified: Secondary | ICD-10-CM | POA: Insufficient documentation

## 2015-06-09 DIAGNOSIS — I70212 Atherosclerosis of native arteries of extremities with intermittent claudication, left leg: Secondary | ICD-10-CM | POA: Diagnosis not present

## 2015-06-09 DIAGNOSIS — F17200 Nicotine dependence, unspecified, uncomplicated: Secondary | ICD-10-CM | POA: Insufficient documentation

## 2015-06-09 DIAGNOSIS — Z79899 Other long term (current) drug therapy: Secondary | ICD-10-CM | POA: Diagnosis not present

## 2015-06-09 DIAGNOSIS — Z9889 Other specified postprocedural states: Secondary | ICD-10-CM | POA: Diagnosis not present

## 2015-06-09 DIAGNOSIS — I872 Venous insufficiency (chronic) (peripheral): Secondary | ICD-10-CM | POA: Diagnosis not present

## 2015-06-09 DIAGNOSIS — Z7982 Long term (current) use of aspirin: Secondary | ICD-10-CM | POA: Insufficient documentation

## 2015-06-09 DIAGNOSIS — I6529 Occlusion and stenosis of unspecified carotid artery: Secondary | ICD-10-CM | POA: Diagnosis not present

## 2015-06-09 HISTORY — DX: Venous insufficiency (chronic) (peripheral): I87.2

## 2015-06-09 HISTORY — DX: Acute myocardial infarction, unspecified: I21.9

## 2015-06-09 HISTORY — DX: Disorder of arteries and arterioles, unspecified: I77.9

## 2015-06-09 HISTORY — DX: Hyperlipidemia, unspecified: E78.5

## 2015-06-09 HISTORY — DX: Peripheral vascular disease, unspecified: I73.9

## 2015-06-09 HISTORY — DX: Unspecified atherosclerosis: I70.90

## 2015-06-09 HISTORY — PX: PERIPHERAL VASCULAR CATHETERIZATION: SHX172C

## 2015-06-09 HISTORY — DX: Syncope and collapse: R55

## 2015-06-09 SURGERY — LOWER EXTREMITY ANGIOGRAPHY
Anesthesia: Moderate Sedation | Laterality: Right

## 2015-06-09 MED ORDER — HEPARIN SODIUM (PORCINE) 1000 UNIT/ML IJ SOLN
INTRAMUSCULAR | Status: AC
  Filled 2015-06-09: qty 1

## 2015-06-09 MED ORDER — TRAMADOL HCL 50 MG PO TABS
50.0000 mg | ORAL_TABLET | Freq: Once | ORAL | Status: AC
  Administered 2015-06-09: 50 mg via ORAL

## 2015-06-09 MED ORDER — HEPARIN (PORCINE) IN NACL 2-0.9 UNIT/ML-% IJ SOLN
INTRAMUSCULAR | Status: AC
  Filled 2015-06-09: qty 1000

## 2015-06-09 MED ORDER — CEFUROXIME SODIUM 1.5 G IJ SOLR
1.5000 g | INTRAMUSCULAR | Status: AC
  Administered 2015-06-09: 1.5 g via INTRAVENOUS

## 2015-06-09 MED ORDER — SODIUM CHLORIDE 0.9 % IV SOLN: INTRAVENOUS | Status: DC

## 2015-06-09 MED ORDER — MIDAZOLAM HCL 2 MG/2ML IJ SOLN
INTRAMUSCULAR | Status: DC | PRN
  Administered 2015-06-09 (×2): 2 mg via INTRAVENOUS

## 2015-06-09 MED ORDER — SODIUM CHLORIDE 0.9 % IV BOLUS (SEPSIS)
INTRAVENOUS | Status: DC | PRN
  Administered 2015-06-09: 500 mL via INTRAVENOUS

## 2015-06-09 MED ORDER — METHYLPREDNISOLONE SODIUM SUCC 125 MG IJ SOLR: 125.0000 mg | INTRAMUSCULAR | Status: DC | PRN

## 2015-06-09 MED ORDER — LIDOCAINE-EPINEPHRINE (PF) 1 %-1:200000 IJ SOLN
INTRAMUSCULAR | Status: DC | PRN
  Administered 2015-06-09: 10 mL via INTRADERMAL

## 2015-06-09 MED ORDER — FENTANYL CITRATE (PF) 100 MCG/2ML IJ SOLN
INTRAMUSCULAR | Status: AC
  Filled 2015-06-09: qty 2

## 2015-06-09 MED ORDER — FENTANYL CITRATE (PF) 100 MCG/2ML IJ SOLN
INTRAMUSCULAR | Status: DC | PRN
  Administered 2015-06-09 (×2): 50 ug via INTRAVENOUS

## 2015-06-09 MED ORDER — ONDANSETRON HCL 4 MG/2ML IJ SOLN: 4.0000 mg | Freq: Four times a day (QID) | INTRAMUSCULAR | Status: DC | PRN

## 2015-06-09 MED ORDER — MIDAZOLAM HCL 5 MG/5ML IJ SOLN
INTRAMUSCULAR | Status: AC
  Filled 2015-06-09: qty 5

## 2015-06-09 MED ORDER — FAMOTIDINE 20 MG PO TABS: 40.0000 mg | ORAL_TABLET | ORAL | Status: DC | PRN

## 2015-06-09 MED ORDER — HEPARIN SODIUM (PORCINE) 1000 UNIT/ML IJ SOLN
INTRAMUSCULAR | Status: DC | PRN
  Administered 2015-06-09: 4000 [IU] via INTRAVENOUS

## 2015-06-09 MED ORDER — CLOPIDOGREL BISULFATE 75 MG PO TABS: 75.0000 mg | ORAL_TABLET | Freq: Every day | ORAL | Status: AC

## 2015-06-09 MED ORDER — HYDROMORPHONE HCL 1 MG/ML IJ SOLN: 1.0000 mg | Freq: Once | INTRAMUSCULAR | Status: DC

## 2015-06-09 MED ORDER — IOHEXOL 300 MG/ML  SOLN
INTRAMUSCULAR | Status: DC | PRN
  Administered 2015-06-09: 80 mL via INTRA_ARTERIAL

## 2015-06-09 MED ORDER — LIDOCAINE-EPINEPHRINE (PF) 1 %-1:200000 IJ SOLN
INTRAMUSCULAR | Status: AC
  Filled 2015-06-09: qty 30

## 2015-06-09 SURGICAL SUPPLY — 26 items
BALLN DORADO 7X80X80 (BALLOONS) ×4
BALLN LUTONIX DCB 4X80X130 (BALLOONS) ×4
BALLN ULTRVRSE 5X220X150 (BALLOONS) ×4
BALLN ULTRVRSE 6X220X130 (BALLOONS) ×4
BALLN ULTRVRSE 6X300X130 (BALLOONS)
BALLOON DORADO 7X80X80 (BALLOONS) ×2 IMPLANT
BALLOON LUTONIX DCB 4X80X130 (BALLOONS) ×2 IMPLANT
BALLOON ULTRVRSE 5X220X150 (BALLOONS) ×2 IMPLANT
BALLOON ULTRVRSE 6X220X130 (BALLOONS) ×2 IMPLANT
BALLOON ULTRVRSE 6X300X130 (BALLOONS) IMPLANT
CATH CXI SUPP ANG 4FR 135 (MICROCATHETER) ×2 IMPLANT
CATH CXI SUPP ANG 4FR 135CM (MICROCATHETER) ×4
CATH PIG 70CM (CATHETERS) ×4 IMPLANT
CATH VERT 100CM (CATHETERS) ×4 IMPLANT
DEVICE PRESTO INFLATION (MISCELLANEOUS) ×4 IMPLANT
DEVICE STARCLOSE SE CLOSURE (Vascular Products) ×4 IMPLANT
GLIDEWIRE ADV .035X260CM (WIRE) ×4 IMPLANT
PACK ANGIOGRAPHY (CUSTOM PROCEDURE TRAY) ×4 IMPLANT
SHEATH ANL2 6FRX45 HC (SHEATH) ×4 IMPLANT
SHEATH BRITE TIP 5FRX11 (SHEATH) ×4 IMPLANT
STENT VIABAHN 6X150X120 (Permanent Stent) ×4 IMPLANT
STENT VIABAHN 6X250X120 (Permanent Stent) ×4 IMPLANT
SYR MEDRAD MARK V 150ML (SYRINGE) ×4 IMPLANT
TUBING CONTRAST HIGH PRESS 72 (TUBING) ×4 IMPLANT
WIRE G V18X300CM (WIRE) ×8 IMPLANT
WIRE J 3MM .035X145CM (WIRE) ×4 IMPLANT

## 2015-06-09 NOTE — H&P (Signed)
  [facility] History & Physical Update  The patient was interviewed and re-examined.  The patient's previous History and Physical has been reviewed and is unchanged.  There is no change in the plan of care. We plan to proceed with the scheduled procedure.  Hallelujah Wysong, MD  06/09/2015, 8:03 AM

## 2015-06-09 NOTE — Op Note (Signed)
[facility] Percutaneous Study/Intervention Procedural Note   Date of Surgery: 06/09/2015  Surgeon(s):Fradel Baldonado   Assistants:none  Pre-operative Diagnosis: PAD with ulceration right lower extremity and PAD with claudication left lower extremity  Post-operative diagnosis: Same  Procedure(s) Performed: 1. Ultrasound guidance for vascular access left femoral artery 2. Catheter placement into right peroneal artery from left femoral approach 3. Aortogram and selective right lower extremity angiogram 4. Percutaneous transluminal angioplasty of above-knee popliteal artery and superficial femoral artery up through the common femoral artery with 5 mm diameter and 6 mm diameter angioplasty balloons 5. Viabahn covered stent placement 2 to the right SFA and above-knee popliteal artery for greater than 50% residual stenosis and dissection throughout much of the SFA and above-knee popliteal artery after angioplasty  6. StarClose closure device left femoral artery  EBL: 25 cc  Contrast: 80 cc  Fluro Time: 14.9 minutes  Moderate Conscious Sedation Time: approximately 75 minutes using 4 mg of Versed and 100 mcg of Fentanyl  Indications: Patient is a 70 y.o.male with severe peripheral vascular disease and ulcerations of the right foot. He has claudication in the left leg.. The patient has noninvasive study showing some recurrent stenosis in the left SFA stent with a long right SFA occlusion. The patient is brought in for angiography for further evaluation and potential treatment. Risks and benefits are discussed and informed consent is obtained  Procedure: The patient was identified and appropriate procedural time out was performed. The patient was then placed supine on the table and prepped and draped in the usual sterile fashion.Moderate conscious sedation was administered during a face to  face encounter with the patient throughout the procedure with my supervision of the RN administering medicines and monitoring the patient's vital signs, pulse oximetry, telemetry and mental status throughout from the start of the procedure until the patient was taken to the recovery room. Ultrasound was used to evaluate the left common femoral artery. It was patent . A digital ultrasound image was acquired. A Seldinger needle was used to access the left common femoral artery under direct ultrasound guidance and a permanent image was performed. A 0.035 J wire was advanced without resistance and a 5Fr sheath was placed. Pigtail catheter was placed into the aorta and an AP aortogram was performed. This demonstrated multiple bilateral renal arteries and normal aorta and iliac segments without significant stenosis. The previous iliac stents bilaterally were patent. I then crossed the aortic bifurcation and advanced to the right femoral head. Selective right lower extremity angiogram was then performed. This demonstrated long SFA occlusion with reconstitution of the above-knee popliteal artery. Mild stenosis of the tibioperoneal trunk of about 40%. Two-vessel runoff distally. Calcific disease of the right common femoral artery which was mild to moderate. The patient was systemically heparinized and a 6 [place] Ansell sheath was then placed over the [employer] wire. I then used a Kumpe catheter and the advantage wire to navigate into the SFA occlusion and cross this with minimal difficulty using the advantage wire and then exchanging for a CXI catheter to confirm intraluminal flow in the below-knee popliteal artery. The lesion was highly calcific, and I exchanged for a 0.018 wire for a lower profile balloon to start treatment. A 5 mm diameter by 22 cm length angioplasty balloon was inflated from just above the knee up to the origin of the superficial femoral artery. Following this, there were still multiple  areas of high-grade residual stenosis and dissection particularly in the mid to distal SFA.  I upsized to a 6 mm diameter by 22 cm length angioplasty balloon and again inflated this from the above-knee popliteal artery up through the SFA and into the common femoral artery to treat the origin of the SFA as well. These inflations were from 8-10 atm. Following the second set of inflations, the residual stenosis and dissection was still high-grade and flow limiting throughout the right SFA. Stents were required. A 6 mm diameter by 25 cm length stent as well as a 6 mm diameter by 15 cm length stent were deployed from the proximal SFA encompassing the entirety of the SFA into the above-knee popliteal artery to treat the reentry point. These were postdilated with a 6 mm balloon as well as a 7 mm diameter high pressure balloon for a highly calcific lesion at Hunter's canal. Completion angiogram following stent placement showed no more than 20% residual stenosis with brisk flow distally and his flow had clearly been significantly improved. I elected to terminate the procedure. The sheath was removed and StarClose closure device was deployed in the left femoral artery with excellent hemostatic result. The patient was taken to the recovery room in stable condition having tolerated the procedure well.  Findings:  Aortogram: Multiple renal arteries, aorta patent, iliac arteries without significant stenosis with previous bilateral common iliac artery stents patent  Right Lower Extremity:  Long SFA occlusion with reconstitution of the above-knee popliteal artery. Mild stenosis of the tibioperoneal trunk of about 40%. Two-vessel runoff distally. Calcific disease of the right common femoral artery which was mild to moderate.   Disposition: Patient was taken to the recovery room in stable condition having tolerated the procedure well.  Complications: None  Jhoanna Heyde 06/09/2015 10:54 AM

## 2015-06-09 NOTE — Procedures (Signed)
Spoke with Dr Lucky Cowboy in regards to sudden bleed from left femoral site bleed.  Pressure held for 10 minutes.  Site is soft nontender. Pt lying flat in bed.  Will closely monitor the patient.

## 2015-06-09 NOTE — Procedures (Signed)
Pt left groin site intact with no active bleeding, no hematoma.  Pt d/c to home

## 2015-06-09 NOTE — Discharge Instructions (Signed)
Angiogram, Care After °Refer to this sheet in the next few weeks. These instructions provide you with information about caring for yourself after your procedure. Your health care provider may also give you more specific instructions. Your treatment has been planned according to current medical practices, but problems sometimes occur. Call your health care provider if you have any problems or questions after your procedure. °WHAT TO EXPECT AFTER THE PROCEDURE °After your procedure, it is typical to have the following: °· Bruising at the catheter insertion site that usually fades within 1-2 weeks. °· Blood collecting in the tissue (hematoma) that may be painful to the touch. It should usually decrease in size and tenderness within 1-2 weeks. °HOME CARE INSTRUCTIONS °· Take medicines only as directed by your health care provider. °· You may shower 24-48 hours after the procedure or as directed by your health care provider. Remove the bandage (dressing) and gently wash the site with plain soap and water. Pat the area dry with a clean towel. Do not rub the site, because this may cause bleeding. °· Do not take baths, swim, or use a hot tub until your health care provider approves. °· Check your insertion site every day for redness, swelling, or drainage. °· Do not apply powder or lotion to the site. °· Do not lift over 10 lb (4.5 kg) for 5 days after your procedure or as directed by your health care provider. °· Ask your health care provider when it is okay to: °¨ Return to work or school. °¨ Resume usual physical activities or sports. °¨ Resume sexual activity. °· Do not drive home if you are discharged the same day as the procedure. Have someone else drive you. °· You may drive 24 hours after the procedure unless otherwise instructed by your health care provider. °· Do not operate machinery or power tools for 24 hours after the procedure or as directed by your health care provider. °· If your procedure was done as an  outpatient procedure, which means that you went home the same day as your procedure, a responsible adult should be with you for the first 24 hours after you arrive home. °· Keep all follow-up visits as directed by your health care provider. This is important. °SEEK MEDICAL CARE IF: °· You have a fever. °· You have chills. °· You have increased bleeding from the catheter insertion site. Hold pressure on the site. °SEEK IMMEDIATE MEDICAL CARE IF: °· You have unusual pain at the catheter insertion site. °· You have redness, warmth, or swelling at the catheter insertion site. °· You have drainage (other than a small amount of blood on the dressing) from the catheter insertion site. °· The catheter insertion site is bleeding, and the bleeding does not stop after 30 minutes of holding steady pressure on the site. °· The area near or just beyond the catheter insertion site becomes pale, cool, tingly, or numb. °  °This information is not intended to replace advice given to you by your health care provider. Make sure you discuss any questions you have with your health care provider. °  °Document Released: 10/26/2004 Document Revised: 04/30/2014 Document Reviewed: 09/10/2012 °Elsevier Interactive Patient Education ©2016 [employer] ° °

## 2015-06-10 ENCOUNTER — Encounter: Payer: Self-pay | Admitting: Vascular Surgery

## 2015-06-13 ENCOUNTER — Other Ambulatory Visit: Payer: Self-pay | Admitting: Vascular Surgery

## 2015-06-16 ENCOUNTER — Ambulatory Visit
Admission: RE | Admit: 2015-06-16 | Discharge: 2015-06-16 | Disposition: A | Discharge status: Home / Self Care | Payer: Medicare Other | Source: Ambulatory Visit | Attending: Vascular Surgery | Admitting: Vascular Surgery

## 2015-06-16 ENCOUNTER — Encounter
Admission: RE | Disposition: A | Discharge status: Home / Self Care | Payer: Self-pay | Source: Ambulatory Visit | Attending: Vascular Surgery

## 2015-06-16 DIAGNOSIS — R55 Syncope and collapse: Secondary | ICD-10-CM | POA: Diagnosis not present

## 2015-06-16 DIAGNOSIS — I70245 Atherosclerosis of native arteries of left leg with ulceration of other part of foot: Secondary | ICD-10-CM | POA: Diagnosis not present

## 2015-06-16 DIAGNOSIS — L97529 Non-pressure chronic ulcer of other part of left foot with unspecified severity: Secondary | ICD-10-CM | POA: Insufficient documentation

## 2015-06-16 DIAGNOSIS — F17200 Nicotine dependence, unspecified, uncomplicated: Secondary | ICD-10-CM | POA: Insufficient documentation

## 2015-06-16 DIAGNOSIS — Z7982 Long term (current) use of aspirin: Secondary | ICD-10-CM | POA: Diagnosis not present

## 2015-06-16 DIAGNOSIS — Z79899 Other long term (current) drug therapy: Secondary | ICD-10-CM | POA: Insufficient documentation

## 2015-06-16 DIAGNOSIS — I872 Venous insufficiency (chronic) (peripheral): Secondary | ICD-10-CM | POA: Diagnosis not present

## 2015-06-16 DIAGNOSIS — I110 Hypertensive heart disease with heart failure: Secondary | ICD-10-CM | POA: Diagnosis not present

## 2015-06-16 DIAGNOSIS — I509 Heart failure, unspecified: Secondary | ICD-10-CM | POA: Diagnosis not present

## 2015-06-16 DIAGNOSIS — Z859 Personal history of malignant neoplasm, unspecified: Secondary | ICD-10-CM | POA: Diagnosis not present

## 2015-06-16 DIAGNOSIS — I252 Old myocardial infarction: Secondary | ICD-10-CM | POA: Diagnosis not present

## 2015-06-16 DIAGNOSIS — I6529 Occlusion and stenosis of unspecified carotid artery: Secondary | ICD-10-CM | POA: Insufficient documentation

## 2015-06-16 DIAGNOSIS — E785 Hyperlipidemia, unspecified: Secondary | ICD-10-CM | POA: Diagnosis not present

## 2015-06-16 HISTORY — PX: PERIPHERAL VASCULAR CATHETERIZATION: SHX172C

## 2015-06-16 SURGERY — LOWER EXTREMITY ANGIOGRAPHY
Anesthesia: Moderate Sedation | Laterality: Left

## 2015-06-16 MED ORDER — IOHEXOL 300 MG/ML  SOLN
INTRAMUSCULAR | Status: DC | PRN
  Administered 2015-06-16: 65 mL via INTRA_ARTERIAL

## 2015-06-16 MED ORDER — HEPARIN (PORCINE) IN NACL 2-0.9 UNIT/ML-% IJ SOLN
INTRAMUSCULAR | Status: AC
  Filled 2015-06-16: qty 1000

## 2015-06-16 MED ORDER — ACETAMINOPHEN 325 MG RE SUPP
325.0000 mg | RECTAL | Status: DC | PRN
  Filled 2015-06-16: qty 2

## 2015-06-16 MED ORDER — LABETALOL HCL 5 MG/ML IV SOLN: 10.0000 mg | INTRAVENOUS | Status: DC | PRN

## 2015-06-16 MED ORDER — PHENOL 1.4 % MT LIQD: 1.0000 | OROMUCOSAL | Status: DC | PRN

## 2015-06-16 MED ORDER — ONDANSETRON HCL 4 MG/2ML IJ SOLN: 4.0000 mg | Freq: Four times a day (QID) | INTRAMUSCULAR | Status: DC | PRN

## 2015-06-16 MED ORDER — SODIUM CHLORIDE 0.9 % IV SOLN
INTRAVENOUS | Status: DC
  Administered 2015-06-16: 08:00:00 via INTRAVENOUS

## 2015-06-16 MED ORDER — OXYCODONE-ACETAMINOPHEN 5-325 MG PO TABS: 1.0000 | ORAL_TABLET | ORAL | Status: DC | PRN

## 2015-06-16 MED ORDER — FAMOTIDINE 20 MG PO TABS: 40.0000 mg | ORAL_TABLET | ORAL | Status: DC | PRN

## 2015-06-16 MED ORDER — HEPARIN SODIUM (PORCINE) 1000 UNIT/ML IJ SOLN
INTRAMUSCULAR | Status: DC | PRN
  Administered 2015-06-16: 4000 [IU] via INTRAVENOUS

## 2015-06-16 MED ORDER — DEXTROSE 5 % IV SOLN
INTRAVENOUS | Status: AC
  Filled 2015-06-16 (×36): qty 1.5

## 2015-06-16 MED ORDER — METOPROLOL TARTRATE 1 MG/ML IV SOLN: 2.0000 mg | INTRAVENOUS | Status: DC | PRN

## 2015-06-16 MED ORDER — DEXTROSE 5 % IV SOLN
1.5000 g | INTRAVENOUS | Status: AC
  Administered 2015-06-16: 1.5 g via INTRAVENOUS

## 2015-06-16 MED ORDER — METHYLPREDNISOLONE SODIUM SUCC 125 MG IJ SOLR: 125.0000 mg | INTRAMUSCULAR | Status: DC | PRN

## 2015-06-16 MED ORDER — LIDOCAINE-EPINEPHRINE (PF) 1 %-1:200000 IJ SOLN
INTRAMUSCULAR | Status: DC | PRN
  Administered 2015-06-16: 10 mL via INTRADERMAL

## 2015-06-16 MED ORDER — FENTANYL CITRATE (PF) 100 MCG/2ML IJ SOLN
INTRAMUSCULAR | Status: DC | PRN
  Administered 2015-06-16 (×2): 50 ug via INTRAVENOUS

## 2015-06-16 MED ORDER — MIDAZOLAM HCL 5 MG/5ML IJ SOLN
INTRAMUSCULAR | Status: AC
  Filled 2015-06-16: qty 5

## 2015-06-16 MED ORDER — HEPARIN SODIUM (PORCINE) 1000 UNIT/ML IJ SOLN
INTRAMUSCULAR | Status: AC
  Filled 2015-06-16: qty 1

## 2015-06-16 MED ORDER — ONDANSETRON HCL 4 MG/2ML IJ SOLN
4.0000 mg | Freq: Four times a day (QID) | INTRAMUSCULAR | Status: DC | PRN
Start: 2015-06-16 — End: 2015-06-16

## 2015-06-16 MED ORDER — HYDROMORPHONE HCL 1 MG/ML IJ SOLN: 0.5000 mg | INTRAMUSCULAR | Status: DC | PRN

## 2015-06-16 MED ORDER — SODIUM CHLORIDE 0.9 % IV SOLN: 500.0000 mL | Freq: Once | INTRAVENOUS | Status: DC | PRN

## 2015-06-16 MED ORDER — FENTANYL CITRATE (PF) 100 MCG/2ML IJ SOLN
INTRAMUSCULAR | Status: AC
  Filled 2015-06-16: qty 2

## 2015-06-16 MED ORDER — HYDROMORPHONE HCL 1 MG/ML IJ SOLN: 1.0000 mg | Freq: Once | INTRAMUSCULAR | Status: DC

## 2015-06-16 MED ORDER — GUAIFENESIN-DM 100-10 MG/5ML PO SYRP
15.0000 mL | ORAL_SOLUTION | ORAL | Status: DC | PRN
  Filled 2015-06-16: qty 15

## 2015-06-16 MED ORDER — ACETAMINOPHEN 325 MG PO TABS: 325.0000 mg | ORAL_TABLET | ORAL | Status: DC | PRN

## 2015-06-16 MED ORDER — MIDAZOLAM HCL 2 MG/2ML IJ SOLN
INTRAMUSCULAR | Status: DC | PRN
  Administered 2015-06-16: 2 mg via INTRAVENOUS
  Administered 2015-06-16: 1 mg via INTRAVENOUS

## 2015-06-16 MED ORDER — LIDOCAINE-EPINEPHRINE (PF) 1 %-1:200000 IJ SOLN
INTRAMUSCULAR | Status: AC
  Filled 2015-06-16: qty 30

## 2015-06-16 MED ORDER — HYDRALAZINE HCL 20 MG/ML IJ SOLN: 5.0000 mg | INTRAMUSCULAR | Status: DC | PRN

## 2015-06-16 SURGICAL SUPPLY — 16 items
BALLN LUTONIX 6X150X130 (BALLOONS) ×4
BALLN LUTONIX DCB 5X80X130 (BALLOONS) ×4
BALLOON LUTONIX 6X150X130 (BALLOONS) ×2 IMPLANT
BALLOON LUTONIX DCB 5X80X130 (BALLOONS) ×2 IMPLANT
CATH PIG 70CM (CATHETERS) ×4 IMPLANT
CATH VERT 100CM (CATHETERS) ×4 IMPLANT
DEVICE PRESTO INFLATION (MISCELLANEOUS) ×4 IMPLANT
DEVICE STARCLOSE SE CLOSURE (Vascular Products) ×4 IMPLANT
GLIDECATH 4FR STR (CATHETERS) ×4 IMPLANT
GLIDEWIRE ADV .035X260CM (WIRE) ×4 IMPLANT
PACK ANGIOGRAPHY (CUSTOM PROCEDURE TRAY) ×4 IMPLANT
SHEATH ANL2 6FRX45 HC (SHEATH) ×4 IMPLANT
SHEATH BRITE TIP 5FRX11 (SHEATH) ×4 IMPLANT
SYR MEDRAD MARK V 150ML (SYRINGE) ×4 IMPLANT
TUBING CONTRAST HIGH PRESS 72 (TUBING) ×4 IMPLANT
WIRE J 3MM .035X145CM (WIRE) ×4 IMPLANT

## 2015-06-16 NOTE — H&P (Signed)
  [facility] History & Physical Update  The patient was interviewed and re-examined.  The patient's previous History and Physical has been reviewed and is unchanged.  There is no change in the plan of care. We plan to proceed with the scheduled procedure.  Marquize Seib, MD  06/16/2015, 8:03 AM

## 2015-06-16 NOTE — Op Note (Signed)
[facility] Percutaneous Study/Intervention Procedural Note   Date of Surgery: 06/16/2015  Surgeon(s):Amonie Wisser   Assistants:none  Pre-operative Diagnosis: PAD with claudication and ulceration left lower extremity  Post-operative diagnosis: Same  Procedure(s) Performed: 1. Ultrasound guidance for vascular access right femoral artery 2. Catheter placement into left popliteal artery from right femoral approach 3. selective left lower extremity angiogram 4. Percutaneous transluminal angioplasty of left above-knee popliteal artery with 5 mm diameter by 8 cm length Lutonix drug-coated angioplasty balloon 5. Percutaneous transluminal angioplasty of left external iliac artery, common femoral artery, and superficial femoral artery origin with 6 mm diameter by 15 cm length Lutonix drug-coated angioplasty balloon  6.  StarClose closure device right femoral artery  EBL: 25 cc  Contrast: 65 cc  Fluro Time: 5.5 minutes  Moderate Conscious Sedation Time: approximately 45 minutes using 3 mg of Versed and 100 mcg of Fentanyl  Indications: Patient is a 70 y.o.male with claudication and shallow toe ulcerations on the left foot. The patient has noninvasive study showing some disease in the distal external iliac and common femoral region. The patient is brought in for angiography for further evaluation and potential treatment. Risks and benefits are discussed and informed consent is obtained  Procedure: The patient was identified and appropriate procedural time out was performed. The patient was then placed supine on the table and prepped and draped in the usual sterile fashion.Moderate conscious sedation was administered during a face to face encounter with the patient throughout the procedure with my supervision of the RN administering medicines and monitoring the patient's vital signs, pulse  oximetry, telemetry and mental status throughout from the start of the procedure until the patient was taken to the recovery room. Ultrasound was used to evaluate the right common femoral artery. It was patent . A digital ultrasound image was acquired. A Seldinger needle was used to access the right common femoral artery under direct ultrasound guidance and a permanent image was performed. A 0.035 J wire was advanced without resistance and a 5Fr sheath was placed.A formal aortagram was not done as we had one from last week. I then crossed the aortic bifurcation and advanced to the left femoral head. Selective left lower extremity angiogram was then performed. This demonstrated stenosis starting in the distal external iliac artery and tracking through the common femoral artery and origin of the superficial femoral artery that was in the moderate 70-80% range. The profunda femoris artery had a diseased origin of greater than 75%. The popliteal artery above the knee but below the previous stent had a stenosis in the 60-70% range. Two-vessel runoff distally. The patient was systemically heparinized and a 6 [place] Ansell sheath was then placed over the [employer] wire. I then used a Kumpe catheter and the advantage wire to navigate through both areas of stenosis and confirm intraluminal flow in the below-knee popliteal artery. I then replaced the wire. The above-knee popliteal lesion was treated with a 5 mm diameter by 8 cm length Lutonix drug-coated angioplasty balloon inflated to 12 atm for 1 minute. Completion angiogram showed about a 20% residual stenosis that was not flow limiting. I then turned my attention to the common femoral and distal external iliac artery lesion. A 6 mm diameter by 15 cm length Lutonix drug-coated angioplasty balloon was inflated across the external iliac artery, common femoral artery, and into the SFA origin. This was inflated to 12 atm for 1 minute. With a suboptimal result, I  then reinflated the balloon to 10  atm for 2 minutes. Completion and rhythm following this still showed about a 30-40% residual stenosis and significant disease at the origin of the profunda femoris artery which was there previously, but the flow was significantly improved and I felt we had helped this much as possible from a percutaneous standpoint. I elected to terminate the procedure. The sheath was removed and StarClose closure device was deployed in the left femoral artery with excellent hemostatic result. The patient was taken to the recovery room in stable condition having tolerated the procedure well.  Findings:   Left Lower Extremity: Stenosis starting in the distal external iliac artery and tracking through the common femoral artery and origin of the superficial femoral artery that was in the moderate 70-80% range. The profunda femoris artery had a diseased origin of greater than 75%. The popliteal artery above the knee but below the previous stent had a stenosis in the 60-70% range. Two-vessel runoff distally.   Disposition: Patient was taken to the recovery room in stable condition having tolerated the procedure well.  Complications: None  Joye Wesenberg 06/16/2015 9:30 AM

## 2015-06-16 NOTE — Discharge Instructions (Signed)
Angiogram, Care After °Refer to this sheet in the next few weeks. These instructions provide you with information about caring for yourself after your procedure. Your health care provider may also give you more specific instructions. Your treatment has been planned according to current medical practices, but problems sometimes occur. Call your health care provider if you have any problems or questions after your procedure. °WHAT TO EXPECT AFTER THE PROCEDURE °After your procedure, it is typical to have the following: °· Bruising at the catheter insertion site that usually fades within 1-2 weeks. °· Blood collecting in the tissue (hematoma) that may be painful to the touch. It should usually decrease in size and tenderness within 1-2 weeks. °HOME CARE INSTRUCTIONS °· Take medicines only as directed by your health care provider. °· You may shower 24-48 hours after the procedure or as directed by your health care provider. Remove the bandage (dressing) and gently wash the site with plain soap and water. Pat the area dry with a clean towel. Do not rub the site, because this may cause bleeding. °· Do not take baths, swim, or use a hot tub until your health care provider approves. °· Check your insertion site every day for redness, swelling, or drainage. °· Do not apply powder or lotion to the site. °· Do not lift over 10 lb (4.5 kg) for 5 days after your procedure or as directed by your health care provider. °· Ask your health care provider when it is okay to: °¨ Return to work or school. °¨ Resume usual physical activities or sports. °¨ Resume sexual activity. °· Do not drive home if you are discharged the same day as the procedure. Have someone else drive you. °· You may drive 24 hours after the procedure unless otherwise instructed by your health care provider. °· Do not operate machinery or power tools for 24 hours after the procedure or as directed by your health care provider. °· If your procedure was done as an  outpatient procedure, which means that you went home the same day as your procedure, a responsible adult should be with you for the first 24 hours after you arrive home. °· Keep all follow-up visits as directed by your health care provider. This is important. °SEEK MEDICAL CARE IF: °· You have a fever. °· You have chills. °· You have increased bleeding from the catheter insertion site. Hold pressure on the site. °SEEK IMMEDIATE MEDICAL CARE IF: °· You have unusual pain at the catheter insertion site. °· You have redness, warmth, or swelling at the catheter insertion site. °· You have drainage (other than a small amount of blood on the dressing) from the catheter insertion site. °· The catheter insertion site is bleeding, and the bleeding does not stop after 30 minutes of holding steady pressure on the site. °· The area near or just beyond the catheter insertion site becomes pale, cool, tingly, or numb. °  °This information is not intended to replace advice given to you by your health care provider. Make sure you discuss any questions you have with your health care provider. °  °Document Released: 10/26/2004 Document Revised: 04/30/2014 Document Reviewed: 09/10/2012 °Elsevier Interactive Patient Education ©2016 [employer] ° °

## 2015-06-17 ENCOUNTER — Encounter: Payer: Self-pay | Admitting: Vascular Surgery

## 2015-07-11 ENCOUNTER — Other Ambulatory Visit: Payer: Self-pay | Admitting: Vascular Surgery

## 2015-07-12 ENCOUNTER — Encounter
Admission: RE | Admit: 2015-07-12 | Discharge: 2015-07-12 | Disposition: A | Discharge status: Home / Self Care | Payer: Medicare Other | Source: Ambulatory Visit | Attending: Vascular Surgery | Admitting: Vascular Surgery

## 2015-07-12 DIAGNOSIS — Z01812 Encounter for preprocedural laboratory examination: Secondary | ICD-10-CM | POA: Diagnosis not present

## 2015-07-12 HISTORY — DX: Hypothyroidism, unspecified: E03.9

## 2015-07-12 LAB — CBC WITH DIFFERENTIAL/PLATELET
BASOS ABS: 0 10*3/uL (ref 0–0.1)
Basophils Relative: 1 %
Eosinophils Absolute: 0.1 10*3/uL (ref 0–0.7)
HEMATOCRIT: 41.3 % (ref 40.0–52.0)
Hemoglobin: 13.3 g/dL (ref 13.0–18.0)
Lymphs Abs: 0.4 10*3/uL — ABNORMAL LOW (ref 1.0–3.6)
MCH: 29.9 pg (ref 26.0–34.0)
MCHC: 32.1 g/dL (ref 32.0–36.0)
MCV: 93 fL (ref 80.0–100.0)
Monocytes Absolute: 0.5 10*3/uL (ref 0.2–1.0)
Monocytes Relative: 12 %
NEUTROS ABS: 3 10*3/uL (ref 1.4–6.5)
PLATELETS: 94 10*3/uL — AB (ref 150–440)
RBC: 4.44 MIL/uL (ref 4.40–5.90)
RDW: 17.6 % — ABNORMAL HIGH (ref 11.5–14.5)
WBC: 4 10*3/uL (ref 3.8–10.6)

## 2015-07-12 LAB — TYPE AND SCREEN
ABO/RH(D): O POS
ANTIBODY SCREEN: NEGATIVE

## 2015-07-12 LAB — ABO/RH: ABO/RH(D): O POS

## 2015-07-12 LAB — PROTIME-INR
INR: 2.46
Prothrombin Time: 26.4 seconds — ABNORMAL HIGH (ref 11.4–15.0)

## 2015-07-12 LAB — BASIC METABOLIC PANEL
Anion gap: 8 (ref 5–15)
BUN: 36 mg/dL — ABNORMAL HIGH (ref 6–20)
CO2: 26 mmol/L (ref 22–32)
Calcium: 9 mg/dL (ref 8.9–10.3)
Chloride: 103 mmol/L (ref 101–111)
Creatinine, Ser: 1.59 mg/dL — ABNORMAL HIGH (ref 0.61–1.24)
GFR calc Af Amer: 49 mL/min — ABNORMAL LOW (ref >60)
GFR, EST NON AFRICAN AMERICAN: 42 mL/min — AB (ref >60)
Glucose, Bld: 86 mg/dL (ref 65–99)
POTASSIUM: 4.3 mmol/L (ref 3.5–5.1)
SODIUM: 137 mmol/L (ref 135–145)

## 2015-07-12 LAB — SURGICAL PCR SCREEN
MRSA, PCR: NEGATIVE
STAPHYLOCOCCUS AUREUS: NEGATIVE

## 2015-07-12 LAB — APTT: APTT: 48 s — AB (ref 24–36)

## 2015-07-12 NOTE — Patient Instructions (Signed)
  Your procedure is scheduled on: Wednesday 07/22/2015 Report to Day Surgery. 2ND FLOOR MEDICAL MALL ENTRANCE To find out your arrival time please call [phone] between 1PM - 3PM on Tuesday 07/19/15.  Remember: Instructions that are not followed completely may result in serious medical risk, up to and including death, or upon the discretion of your surgeon and anesthesiologist your surgery may need to be rescheduled.    __X__ 1. Do not eat food or drink liquids after midnight. No gum chewing or hard candies.     __X__ 2. No Alcohol for 24 hours before or after surgery.   ____ 3. Bring all medications with you on the day of surgery if instructed.    __X__ 4. Notify your doctor if there is any change in your medical condition     (cold, fever, infections).     Do not wear jewelry, make-up, hairpins, clips or nail polish.  Do not wear lotions, powders, or perfumes.   Do not shave 48 hours prior to surgery. Men may shave face and neck.  Do not bring valuables to the hospital.    [facility] is not responsible for any belongings or valuables.               Contacts, dentures or bridgework may not be worn into surgery.  Leave your suitcase in the car. After surgery it may be brought to your room.  For patients admitted to the hospital, discharge time is determined by your                treatment team.   Patients discharged the day of surgery will not be allowed to drive home.   Please read over the following fact sheets that you were given:   MRSA Information and Surgical Site Infection Prevention   __X__ Take these medicines the morning of surgery with A SIP OF WATER:    1. AMLODIPINE  2. ATENOLOL  3. LEVOTHYROXINE  4. SERTRALINE  5.  6.  ____ Fleet Enema (as directed)   __X__ Use CHG Soap as directed  __X__ Use inhalers on the day of surgery AND BRING RESCUE INHALER WITH YOU  ____ Stop metformin 2 days prior to surgery    ____ Take 1/2 of usual insulin dose the night  before surgery and none on the morning of surgery.   ____ Stop Coumadin/Plavix/aspirin on   ____ Stop Anti-inflammatories on    ____ Stop supplements until after surgery.    ____ Bring C-Pap to the hospital.

## 2015-07-13 NOTE — Pre-Procedure Instructions (Signed)
[place] RISK 07/12/15. CALLED LABS TO DR Ronelle Nigh , ANES AND THEN CALLED AND FAXED TO LAURA AT DR Lucky Cowboy.NO REPEAT LABS PER ANESTHESIA AT THIS TIME .

## 2015-07-20 ENCOUNTER — Ambulatory Visit: Payer: Medicare Other | Admitting: Certified Registered"

## 2015-07-20 ENCOUNTER — Encounter: Payer: Self-pay | Admitting: *Deleted

## 2015-07-20 ENCOUNTER — Encounter: Admission: AD | Disposition: E | Payer: Self-pay | Source: Ambulatory Visit | Attending: Vascular Surgery

## 2015-07-20 ENCOUNTER — Inpatient Hospital Stay
Admission: AD | Admit: 2015-07-20 | Discharge: 2015-07-23 | DRG: 239 | Disposition: E | Payer: Medicare Other | Source: Ambulatory Visit | Attending: Vascular Surgery | Admitting: Vascular Surgery

## 2015-07-20 DIAGNOSIS — F17200 Nicotine dependence, unspecified, uncomplicated: Secondary | ICD-10-CM | POA: Diagnosis present

## 2015-07-20 DIAGNOSIS — E039 Hypothyroidism, unspecified: Secondary | ICD-10-CM | POA: Diagnosis present

## 2015-07-20 DIAGNOSIS — Z978 Presence of other specified devices: Secondary | ICD-10-CM

## 2015-07-20 DIAGNOSIS — I509 Heart failure, unspecified: Secondary | ICD-10-CM | POA: Diagnosis present

## 2015-07-20 DIAGNOSIS — I469 Cardiac arrest, cause unspecified: Principal | ICD-10-CM | POA: Diagnosis present

## 2015-07-20 DIAGNOSIS — I10 Essential (primary) hypertension: Secondary | ICD-10-CM | POA: Diagnosis present

## 2015-07-20 DIAGNOSIS — I252 Old myocardial infarction: Secondary | ICD-10-CM

## 2015-07-20 DIAGNOSIS — E785 Hyperlipidemia, unspecified: Secondary | ICD-10-CM | POA: Diagnosis present

## 2015-07-20 DIAGNOSIS — Z7982 Long term (current) use of aspirin: Secondary | ICD-10-CM

## 2015-07-20 DIAGNOSIS — I70261 Atherosclerosis of native arteries of extremities with gangrene, right leg: Secondary | ICD-10-CM | POA: Diagnosis present

## 2015-07-20 DIAGNOSIS — J449 Chronic obstructive pulmonary disease, unspecified: Secondary | ICD-10-CM | POA: Diagnosis present

## 2015-07-20 DIAGNOSIS — Z8589 Personal history of malignant neoplasm of other organs and systems: Secondary | ICD-10-CM

## 2015-07-20 DIAGNOSIS — R570 Cardiogenic shock: Secondary | ICD-10-CM | POA: Diagnosis not present

## 2015-07-20 DIAGNOSIS — I872 Venous insufficiency (chronic) (peripheral): Secondary | ICD-10-CM | POA: Diagnosis present

## 2015-07-20 DIAGNOSIS — I739 Peripheral vascular disease, unspecified: Secondary | ICD-10-CM | POA: Diagnosis present

## 2015-07-20 DIAGNOSIS — Z79899 Other long term (current) drug therapy: Secondary | ICD-10-CM

## 2015-07-20 DIAGNOSIS — J9600 Acute respiratory failure, unspecified whether with hypoxia or hypercapnia: Secondary | ICD-10-CM | POA: Diagnosis present

## 2015-07-20 DIAGNOSIS — I2510 Atherosclerotic heart disease of native coronary artery without angina pectoris: Secondary | ICD-10-CM | POA: Diagnosis present

## 2015-07-20 DIAGNOSIS — I110 Hypertensive heart disease with heart failure: Secondary | ICD-10-CM | POA: Diagnosis present

## 2015-07-20 DIAGNOSIS — Z4659 Encounter for fitting and adjustment of other gastrointestinal appliance and device: Secondary | ICD-10-CM

## 2015-07-20 HISTORY — PX: TRANSMETATARSAL AMPUTATION: SHX6197

## 2015-07-20 SURGERY — AMPUTATION, FOOT, TRANSMETATARSAL
Anesthesia: General | Laterality: Right | Wound class: Contaminated

## 2015-07-20 MED ORDER — ACETAMINOPHEN 325 MG PO TABS: 650.0000 mg | ORAL_TABLET | Freq: Four times a day (QID) | ORAL | Status: DC | PRN

## 2015-07-20 MED ORDER — RIVAROXABAN 10 MG PO TABS: 10.0000 mg | ORAL_TABLET | Freq: Every day | ORAL | Status: DC

## 2015-07-20 MED ORDER — FLEET ENEMA 7-19 GM/118ML RE ENEM: 1.0000 | ENEMA | Freq: Once | RECTAL | Status: DC | PRN

## 2015-07-20 MED ORDER — HYDROCHLOROTHIAZIDE 25 MG PO TABS: 12.5000 mg | ORAL_TABLET | Freq: Every day | ORAL | Status: DC

## 2015-07-20 MED ORDER — FENTANYL CITRATE (PF) 100 MCG/2ML IJ SOLN
INTRAMUSCULAR | Status: DC | PRN
Start: 2015-07-20 — End: 2015-07-20
  Administered 2015-07-20 (×2): 50 ug via INTRAVENOUS

## 2015-07-20 MED ORDER — FENTANYL CITRATE (PF) 100 MCG/2ML IJ SOLN
INTRAMUSCULAR | Status: AC
  Filled 2015-07-20: qty 2

## 2015-07-20 MED ORDER — HYDROMORPHONE HCL 1 MG/ML IJ SOLN
0.5000 mg | INTRAMUSCULAR | Status: AC | PRN
  Administered 2015-07-20 (×4): 0.5 mg via INTRAVENOUS
  Filled 2015-07-20 (×4): qty 1

## 2015-07-20 MED ORDER — ONDANSETRON HCL 4 MG/2ML IJ SOLN
INTRAMUSCULAR | Status: DC | PRN
  Administered 2015-07-20: 4 mg via INTRAVENOUS

## 2015-07-20 MED ORDER — ONDANSETRON HCL 4 MG/2ML IJ SOLN: 4.0000 mg | Freq: Four times a day (QID) | INTRAMUSCULAR | Status: DC | PRN

## 2015-07-20 MED ORDER — FAMOTIDINE 20 MG PO TABS
ORAL_TABLET | ORAL | Status: AC
  Filled 2015-07-20: qty 1

## 2015-07-20 MED ORDER — ATENOLOL 50 MG PO TABS
50.0000 mg | ORAL_TABLET | Freq: Two times a day (BID) | ORAL | Status: DC
  Administered 2015-07-20: 50 mg via ORAL
  Filled 2015-07-20: qty 2

## 2015-07-20 MED ORDER — TRIAMTERENE-HCTZ 37.5-25 MG PO TABS: 1.0000 | ORAL_TABLET | Freq: Every day | ORAL | Status: DC

## 2015-07-20 MED ORDER — SORBITOL 70 % SOLN
30.0000 mL | Freq: Every day | Status: DC | PRN
Start: 2015-07-20 — End: 2015-07-21
  Filled 2015-07-20: qty 30

## 2015-07-20 MED ORDER — PROPOFOL 10 MG/ML IV BOLUS
INTRAVENOUS | Status: DC | PRN
  Administered 2015-07-20: 100 mg via INTRAVENOUS

## 2015-07-20 MED ORDER — AMLODIPINE BESYLATE 5 MG PO TABS: 2.5000 mg | ORAL_TABLET | Freq: Every day | ORAL | Status: DC

## 2015-07-20 MED ORDER — LEVOTHYROXINE SODIUM 25 MCG PO TABS: 25.0000 ug | ORAL_TABLET | Freq: Every day | ORAL | Status: DC

## 2015-07-20 MED ORDER — DEXTROSE-NACL 5-0.9 % IV SOLN
INTRAVENOUS | Status: DC
  Administered 2015-07-20: 17:00:00 via INTRAVENOUS

## 2015-07-20 MED ORDER — MORPHINE SULFATE (PF) 2 MG/ML IV SOLN: 2.0000 mg | INTRAVENOUS | Status: DC | PRN

## 2015-07-20 MED ORDER — ACETAMINOPHEN 650 MG RE SUPP: 650.0000 mg | Freq: Four times a day (QID) | RECTAL | Status: DC | PRN

## 2015-07-20 MED ORDER — MOMETASONE FURO-FORMOTEROL FUM 200-5 MCG/ACT IN AERO
2.0000 | INHALATION_SPRAY | Freq: Two times a day (BID) | RESPIRATORY_TRACT | Status: DC
  Administered 2015-07-20: 2 via RESPIRATORY_TRACT
  Filled 2015-07-20: qty 8.8

## 2015-07-20 MED ORDER — LIDOCAINE HCL (PF) 2 % IJ SOLN
INTRAMUSCULAR | Status: DC | PRN
  Administered 2015-07-20: 50 mg

## 2015-07-20 MED ORDER — HYDROCODONE-ACETAMINOPHEN 5-325 MG PO TABS
1.0000 | ORAL_TABLET | ORAL | Status: DC | PRN
  Administered 2015-07-20: 2 via ORAL
  Filled 2015-07-20: qty 2

## 2015-07-20 MED ORDER — ASPIRIN EC 81 MG PO TBEC: 81.0000 mg | DELAYED_RELEASE_TABLET | Freq: Every day | ORAL | Status: DC

## 2015-07-20 MED ORDER — ALBUTEROL SULFATE (2.5 MG/3ML) 0.083% IN NEBU: 2.5000 mg | INHALATION_SOLUTION | Freq: Four times a day (QID) | RESPIRATORY_TRACT | Status: DC | PRN

## 2015-07-20 MED ORDER — MAGNESIUM HYDROXIDE 400 MG/5ML PO SUSP: 30.0000 mL | Freq: Every day | ORAL | Status: DC | PRN

## 2015-07-20 MED ORDER — SERTRALINE HCL 50 MG PO TABS: 100.0000 mg | ORAL_TABLET | Freq: Every day | ORAL | Status: DC

## 2015-07-20 MED ORDER — BUPROPION HCL ER (SR) 150 MG PO TB12
150.0000 mg | ORAL_TABLET | Freq: Two times a day (BID) | ORAL | Status: DC
  Administered 2015-07-20: 150 mg via ORAL
  Filled 2015-07-20: qty 1

## 2015-07-20 MED ORDER — DEXTROSE 5 % IV SOLN
2.0000 g | INTRAVENOUS | Status: AC
  Administered 2015-07-20: 2 g via INTRAVENOUS
  Filled 2015-07-20: qty 20

## 2015-07-20 MED ORDER — CEFAZOLIN SODIUM-DEXTROSE 2-4 GM/100ML-% IV SOLN
INTRAVENOUS | Status: AC
  Filled 2015-07-20: qty 100

## 2015-07-20 MED ORDER — HYDROMORPHONE HCL 1 MG/ML IJ SOLN
INTRAMUSCULAR | Status: AC
  Administered 2015-07-20: 0.5 mg via INTRAVENOUS
  Filled 2015-07-20: qty 1

## 2015-07-20 MED ORDER — FENTANYL CITRATE (PF) 100 MCG/2ML IJ SOLN
25.0000 ug | INTRAMUSCULAR | Status: DC | PRN
  Administered 2015-07-20 (×3): 25 ug via INTRAVENOUS

## 2015-07-20 MED ORDER — DOCUSATE SODIUM 100 MG PO CAPS
100.0000 mg | ORAL_CAPSULE | Freq: Two times a day (BID) | ORAL | Status: DC
  Administered 2015-07-20: 100 mg via ORAL
  Filled 2015-07-20: qty 1

## 2015-07-20 MED ORDER — OXYCODONE-ACETAMINOPHEN 5-325 MG PO TABS: 1.0000 | ORAL_TABLET | ORAL | Status: DC | PRN

## 2015-07-20 MED ORDER — ONDANSETRON HCL 4 MG PO TABS: 4.0000 mg | ORAL_TABLET | Freq: Four times a day (QID) | ORAL | Status: DC | PRN

## 2015-07-20 MED ORDER — FAMOTIDINE 20 MG PO TABS
20.0000 mg | ORAL_TABLET | Freq: Once | ORAL | Status: AC
  Administered 2015-07-20: 20 mg via ORAL

## 2015-07-20 MED ORDER — TIOTROPIUM BROMIDE MONOHYDRATE 18 MCG IN CAPS
18.0000 ug | ORAL_CAPSULE | Freq: Every day | RESPIRATORY_TRACT | Status: DC
  Filled 2015-07-20: qty 5

## 2015-07-20 MED ORDER — KETAMINE HCL 50 MG/ML IJ SOLN
INTRAMUSCULAR | Status: DC | PRN
  Administered 2015-07-20: 25 mg via INTRAMUSCULAR
  Administered 2015-07-20: 25 mg via INTRAVENOUS

## 2015-07-20 MED ORDER — ENSURE ENLIVE PO LIQD
237.0000 mL | Freq: Three times a day (TID) | ORAL | Status: DC
  Administered 2015-07-20: 237 mL via ORAL

## 2015-07-20 MED ORDER — ONDANSETRON HCL 4 MG/2ML IJ SOLN: 4.0000 mg | Freq: Once | INTRAMUSCULAR | Status: DC | PRN

## 2015-07-20 MED ORDER — LACTATED RINGERS IV SOLN
INTRAVENOUS | Status: DC
  Administered 2015-07-20: 12:00:00 via INTRAVENOUS

## 2015-07-20 MED ORDER — SODIUM CHLORIDE 0.9% FLUSH: 3.0000 mL | Freq: Two times a day (BID) | INTRAVENOUS | Status: DC

## 2015-07-20 SURGICAL SUPPLY — 38 items
BANDAGE ELASTIC 6 LF NS (GAUZE/BANDAGES/DRESSINGS) ×3 IMPLANT
BLADE OSCILLATING/SAGITTAL (BLADE) ×2
BLADE SAGITTAL WIDE XTHICK NO (BLADE) IMPLANT
BLADE SW THK.38XMED LNG THN (BLADE) ×1 IMPLANT
BNDG COHESIVE 4X5 TAN STRL (GAUZE/BANDAGES/DRESSINGS) ×3 IMPLANT
BNDG GAUZE 4.5X4.1 6PLY STRL (MISCELLANEOUS) ×3 IMPLANT
BRUSH SCRUB 4% CHG (MISCELLANEOUS) ×3 IMPLANT
CANISTER SUCT 1200ML W/VALVE (MISCELLANEOUS) ×3 IMPLANT
CHLORAPREP W/TINT 26ML (MISCELLANEOUS) ×3 IMPLANT
ELECT CAUTERY BLADE 6.4 (BLADE) ×3 IMPLANT
ELECT REM PT RETURN 9FT ADLT (ELECTROSURGICAL) ×3
ELECTRODE REM PT RTRN 9FT ADLT (ELECTROSURGICAL) ×1 IMPLANT
GAUZE PETRO XEROFOAM 1X8 (MISCELLANEOUS) ×3 IMPLANT
GLOVE BIO SURGEON STRL SZ7 (GLOVE) ×9 IMPLANT
GLOVE INDICATOR 7.5 STRL GRN (GLOVE) IMPLANT
GOWN STRL REUS W/ TWL LRG LVL3 (GOWN DISPOSABLE) ×1 IMPLANT
GOWN STRL REUS W/ TWL XL LVL3 (GOWN DISPOSABLE) ×1 IMPLANT
GOWN STRL REUS W/TWL LRG LVL3 (GOWN DISPOSABLE) ×2
GOWN STRL REUS W/TWL XL LVL3 (GOWN DISPOSABLE) ×2
HANDLE YANKAUER SUCT BULB TIP (MISCELLANEOUS) ×3 IMPLANT
KIT RM TURNOVER STRD PROC AR (KITS) ×3 IMPLANT
LABEL OR SOLS (LABEL) ×3 IMPLANT
NS IRRIG 1000ML POUR BTL (IV SOLUTION) IMPLANT
PACK EXTREMITY ARMC (MISCELLANEOUS) ×3 IMPLANT
PAD ABD DERMACEA PRESS 5X9 (GAUZE/BANDAGES/DRESSINGS) ×3 IMPLANT
PAD PREP 24X41 OB/GYN DISP (PERSONAL CARE ITEMS) ×3 IMPLANT
SPONGE LAP 18X18 5 PK (GAUZE/BANDAGES/DRESSINGS) ×6 IMPLANT
STAPLER SKIN PROX 35W (STAPLE) ×3 IMPLANT
STOCKINETTE M/LG 89821 (MISCELLANEOUS) ×3 IMPLANT
SUT ETHILON 3-0 FS-10 30 BLK (SUTURE) ×6
SUT SILK 2 0 (SUTURE) ×2
SUT SILK 2 0 SH (SUTURE) ×3 IMPLANT
SUT SILK 2-0 18XBRD TIE 12 (SUTURE) ×1 IMPLANT
SUT SILK 3 0 (SUTURE) ×2
SUT SILK 3-0 18XBRD TIE 12 (SUTURE) ×1 IMPLANT
SUT VIC AB 0 CT1 36 (SUTURE) ×6 IMPLANT
SUT VIC AB 2-0 CT1 (SUTURE) ×6 IMPLANT
SUTURE EHLN 3-0 FS-10 30 BLK (SUTURE) ×2 IMPLANT

## 2015-07-20 NOTE — H&P (Addendum)
  [facility] History & Physical Update  The patient was interviewed and re-examined.  The patient's previous History and Physical has been reviewed and is unchanged.  There is no change in the plan of care. We plan to proceed with the scheduled procedure. Heart was RRR with chest wall defect present Lungs were equal and clear bilaterally with prominent breath sounds on the right due to chest wall defect  Ares Tegtmeyer, MD  07/18/2015, 1:04 PM

## 2015-07-20 NOTE — Transfer of Care (Signed)
Immediate Anesthesia Transfer of Care Note  Patient: Aaron Fillers Sr.  Procedure(s) Performed: Procedure(s): TRANSMETATARSAL AMPUTATION (Right)  Patient Location: PACU  Anesthesia Type:General  Level of Consciousness: awake  Airway & Oxygen Therapy: Patient Spontanous Breathing and Patient connected to face mask oxygen  Post-op Assessment: Report given to RN  Post vital signs: Reviewed  Last Vitals:  Filed Vitals:   06/24/2015 1138 07/09/2015 1441  BP:  154/91  Pulse: 67 81  Temp:  36.6 C  Resp:  12    Complications: No apparent anesthesia complications

## 2015-07-20 NOTE — Anesthesia Postprocedure Evaluation (Signed)
Anesthesia Post Note  Patient: Aaron BOMKAMP Sr.  Procedure(s) Performed: Procedure(s) (LRB): TRANSMETATARSAL AMPUTATION (Right)  Patient location during evaluation: PACU Anesthesia Type: General Level of consciousness: awake and alert Pain management: pain level controlled Vital Signs Assessment: post-procedure vital signs reviewed and stable Respiratory status: spontaneous breathing, nonlabored ventilation, respiratory function stable and patient connected to nasal cannula oxygen Cardiovascular status: blood pressure returned to baseline and stable Postop Assessment: no signs of nausea or vomiting Anesthetic complications: no    Last Vitals:  Filed Vitals:   07/14/2015 1611 06/27/2015 1645  BP:  117/70  Pulse:  91  Temp: 37 C 36.7 C  Resp:  15    Last Pain:  Filed Vitals:   07/04/2015 1648  PainSc: 4                  Broadus John K Piscitello

## 2015-07-20 NOTE — Anesthesia Preprocedure Evaluation (Signed)
Anesthesia Evaluation  Patient identified by MRN, date of birth, ID band Patient awake    Reviewed: Allergy & Precautions, H&P , NPO status , Patient's Chart, lab work & pertinent test results, reviewed documented beta blocker date and time   Airway Mallampati: III  TM Distance: >3 FB Neck ROM: full    Dental  (+) Teeth Intact   Pulmonary neg pulmonary ROS, neg shortness of breath, COPD,  COPD inhaler, Current Smoker,    Pulmonary exam normal        Cardiovascular Exercise Tolerance: Good hypertension, + Past MI, + Peripheral Vascular Disease and +CHF  negative cardio ROS Normal cardiovascular exam Rhythm:regular Rate:Normal     Neuro/Psych 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, perineum and buttocks followed by Sarina Ser negative neurological ROS  negative psych ROS   GI/Hepatic negative GI ROS, Neg liver ROS,   Endo/Other  negative endocrine ROSHypothyroidism   Renal/GU negative Renal ROS  negative genitourinary   Musculoskeletal   Abdominal   Peds  Hematology negative hematology ROS (+)   Anesthesia Other Findings   Reproductive/Obstetrics negative OB ROS                             Anesthesia Physical Anesthesia Plan  ASA: IV  Anesthesia Plan: General LMA   Post-op Pain Management:    Induction:   Airway Management Planned:   Additional Equipment:   Intra-op Plan:   Post-operative Plan:   Informed Consent: I have reviewed the patients History and Physical, chart, labs and discussed the procedure including the risks, benefits and alternatives for the proposed anesthesia with the patient or authorized representative who has indicated his/her understanding and acceptance.     Plan Discussed with: CRNA  Anesthesia Plan Comments: (Stable but complicated cardiovascular profile with no recent changes in symptom complex.  Will proceed withGa.   Based on rash he is not a candidate for RA.   JA)        Anesthesia Quick Evaluation

## 2015-07-20 NOTE — Progress Notes (Signed)
Patient to same day in wheelchair, patient has multiple scabbed over areas to his upper back, neck, lower back, Radiating around under his rib area.  Patient also has them to his sacral area.  Patient states he has seen A dermatologist, unsure what the diagnosis was, patient states this itches terribly.  Dr. Andree Elk aware and in Room to evaluate patient.

## 2015-07-20 NOTE — Anesthesia Procedure Notes (Signed)
Procedure Name: LMA Insertion Performed by: Rolla Plate Pre-anesthesia Checklist: Patient identified, Patient being monitored, Timeout performed, Emergency Drugs available and Suction available Patient Re-evaluated:Patient Re-evaluated prior to inductionOxygen Delivery Method: Circle system utilized Preoxygenation: Pre-oxygenation with 100% oxygen Intubation Type: IV induction Ventilation: Mask ventilation without difficulty LMA: LMA inserted LMA Size: 4.5 Tube type: Oral Number of attempts: 1 Placement Confirmation: positive ETCO2 and breath sounds checked- equal and bilateral Tube secured with: Tape Dental Injury: Teeth and Oropharynx as per pre-operative assessment

## 2015-07-20 NOTE — Op Note (Signed)
   OPERATIVE NOTE   PROCEDURE: Right TMA  PRE-OPERATIVE DIAGNOSIS: right foot gangrene  POST-OPERATIVE DIAGNOSIS: same as above  SURGEON: Leotis Pain, MD  ASSISTANT(S): none  ANESTHESIA: general  ESTIMATED BLOOD LOSS: 50 cc  FINDING(S): none  SPECIMEN(S):  Right transmetatarsal amputation   INDICATIONS:   Aaron Fillers Sr. is a 70 y.o. male who presents with several right toes with gangrene.  The patient is scheduled for a right transmetatarsal amputation.  I discussed in depth with the patient the risks, benefits, and alternatives to this procedure.  The patient is aware that the risk of this operation included but are not limited to:  bleeding, infection, myocardial infarction, stroke, death, failure to heal amputation wound, and possible need for more proximal amputation.  The patient is aware of the risks and agrees proceed forward with the procedure.  DESCRIPTION:  After full informed written consent was obtained from the patient, the patient was brought back to the operating room, and placed supine upon the operating table.  Prior to induction, the patient received IV antibiotics.  The patient was then prepped and draped in the standard fashion for a right mid foot amputation.  After obtaining adequate anesthesia, the patient was prepped and draped in the standard fashion.  I marked out the skin incision at the base of the toes with a fish mouth approach laterally to allow a tension free closure.  An incision was created with a 15 blade and taken to the bone with electrocautery.  The toes were then dissected off of the MTP joins and freed for removal.  The bovie and the periostial elevator were used to dissect out the transmetatarsal bones about 2-3 cm proximally and then all five of these were transected with the TPS saw.  The fist metatarsal was taken back about another cm to allow a tension free closure. Ronjours were used to remove any sharp or loose pieces of bone.  At this  point, the specimen was passed off the field as right transmetatarsal amputation.  At this point, I clamped all visibly bleeding arteries and veins using a combination of suture ligation with Silk suture and electrocautery.  Bleeding continued to be controlled with electrocautery and suture ligature.  The stump was washed off with sterile normal saline and no further active bleeding was noted.  I reapproximated the anterior and posterior fascia  with interrupted stitches of 0 Vicryl.  This was completed along the entire length of anterior and posterior fascia until there were no more loose space in the fascial line. A layer of 0 Vicryl running sutures were used to complete the closure of the deep space. I then placed a layer of 2-0 Vicryl sutures in the subcutaneous tissue. The skin was then  reapproximated with a series of mattress 3-0 Nylon sutures.  The stump was washed off and dried.  The incision was dressed with Xeroform and  then fluffs were applied.  Kerlix was wrapped around the foot.  He was then taken to the recovery room in stable condition.  COMPLICATIONS: none  CONDITION: stable   Aaron Bryan  06/26/2015, 2:43 PM

## 2015-07-21 ENCOUNTER — Observation Stay: Payer: Medicare Other

## 2015-07-21 DIAGNOSIS — J449 Chronic obstructive pulmonary disease, unspecified: Secondary | ICD-10-CM | POA: Diagnosis present

## 2015-07-21 DIAGNOSIS — I469 Cardiac arrest, cause unspecified: Secondary | ICD-10-CM | POA: Diagnosis present

## 2015-07-21 DIAGNOSIS — E039 Hypothyroidism, unspecified: Secondary | ICD-10-CM | POA: Diagnosis present

## 2015-07-21 DIAGNOSIS — Z79899 Other long term (current) drug therapy: Secondary | ICD-10-CM | POA: Diagnosis not present

## 2015-07-21 DIAGNOSIS — R570 Cardiogenic shock: Secondary | ICD-10-CM | POA: Diagnosis not present

## 2015-07-21 DIAGNOSIS — I872 Venous insufficiency (chronic) (peripheral): Secondary | ICD-10-CM | POA: Diagnosis present

## 2015-07-21 DIAGNOSIS — I2510 Atherosclerotic heart disease of native coronary artery without angina pectoris: Secondary | ICD-10-CM | POA: Diagnosis present

## 2015-07-21 DIAGNOSIS — I110 Hypertensive heart disease with heart failure: Secondary | ICD-10-CM | POA: Diagnosis present

## 2015-07-21 DIAGNOSIS — I739 Peripheral vascular disease, unspecified: Secondary | ICD-10-CM | POA: Diagnosis present

## 2015-07-21 DIAGNOSIS — F17200 Nicotine dependence, unspecified, uncomplicated: Secondary | ICD-10-CM | POA: Diagnosis present

## 2015-07-21 DIAGNOSIS — J9600 Acute respiratory failure, unspecified whether with hypoxia or hypercapnia: Secondary | ICD-10-CM | POA: Diagnosis present

## 2015-07-21 DIAGNOSIS — Z8589 Personal history of malignant neoplasm of other organs and systems: Secondary | ICD-10-CM | POA: Diagnosis not present

## 2015-07-21 DIAGNOSIS — I509 Heart failure, unspecified: Secondary | ICD-10-CM | POA: Diagnosis present

## 2015-07-21 DIAGNOSIS — I10 Essential (primary) hypertension: Secondary | ICD-10-CM | POA: Diagnosis present

## 2015-07-21 DIAGNOSIS — I70261 Atherosclerosis of native arteries of extremities with gangrene, right leg: Secondary | ICD-10-CM | POA: Diagnosis present

## 2015-07-21 DIAGNOSIS — I252 Old myocardial infarction: Secondary | ICD-10-CM | POA: Diagnosis not present

## 2015-07-21 DIAGNOSIS — E785 Hyperlipidemia, unspecified: Secondary | ICD-10-CM | POA: Diagnosis present

## 2015-07-21 DIAGNOSIS — Z7982 Long term (current) use of aspirin: Secondary | ICD-10-CM | POA: Diagnosis not present

## 2015-07-21 LAB — CBC WITH DIFFERENTIAL/PLATELET
BASOS ABS: 0 10*3/uL (ref 0–0.1)
EOS ABS: 0 10*3/uL (ref 0–0.7)
HCT: 38.1 % — ABNORMAL LOW (ref 40.0–52.0)
Hemoglobin: 12 g/dL — ABNORMAL LOW (ref 13.0–18.0)
Lymphocytes Relative: 8 %
Lymphs Abs: 0.7 10*3/uL — ABNORMAL LOW (ref 1.0–3.6)
MCH: 30.1 pg (ref 26.0–34.0)
MCHC: 31.4 g/dL — AB (ref 32.0–36.0)
MCV: 95.7 fL (ref 80.0–100.0)
MONO ABS: 0.3 10*3/uL (ref 0.2–1.0)
NEUTROS ABS: 7 10*3/uL — AB (ref 1.4–6.5)
Neutrophils Relative %: 87 %
PLATELETS: 63 10*3/uL — AB (ref 150–440)
RBC: 3.98 MIL/uL — ABNORMAL LOW (ref 4.40–5.90)
RDW: 18.1 % — AB (ref 11.5–14.5)
WBC: 8 10*3/uL (ref 3.8–10.6)

## 2015-07-21 LAB — COMPREHENSIVE METABOLIC PANEL
ALBUMIN: 2 g/dL — AB (ref 3.5–5.0)
ALT: 179 U/L — ABNORMAL HIGH (ref 17–63)
ANION GAP: 16 — AB (ref 5–15)
AST: 484 U/L — AB (ref 15–41)
Alkaline Phosphatase: 73 U/L (ref 38–126)
BUN: 37 mg/dL — AB (ref 6–20)
CHLORIDE: 104 mmol/L (ref 101–111)
CO2: 25 mmol/L (ref 22–32)
Calcium: 7.9 mg/dL — ABNORMAL LOW (ref 8.9–10.3)
Creatinine, Ser: 2.05 mg/dL — ABNORMAL HIGH (ref 0.61–1.24)
GFR calc Af Amer: 36 mL/min — ABNORMAL LOW (ref >60)
GFR calc non Af Amer: 31 mL/min — ABNORMAL LOW (ref >60)
GLUCOSE: 119 mg/dL — AB (ref 65–99)
POTASSIUM: 3.8 mmol/L (ref 3.5–5.1)
SODIUM: 145 mmol/L (ref 135–145)
TOTAL PROTEIN: 4.6 g/dL — AB (ref 6.5–8.1)
Total Bilirubin: 1.9 mg/dL — ABNORMAL HIGH (ref 0.3–1.2)

## 2015-07-21 LAB — LACTIC ACID, PLASMA: Lactic Acid, Venous: 13.9 mmol/L (ref 0.5–2.0)

## 2015-07-21 MED ORDER — PANTOPRAZOLE SODIUM 40 MG PO TBEC: 40.0000 mg | DELAYED_RELEASE_TABLET | Freq: Every day | ORAL | Status: DC

## 2015-07-21 MED ORDER — SODIUM BICARBONATE 8.4 % IV SOLN
INTRAVENOUS | Status: AC
  Administered 2015-07-21: 50 meq
  Filled 2015-07-21: qty 150

## 2015-07-21 MED ORDER — NOREPINEPHRINE 4 MG/250ML-% IV SOLN
INTRAVENOUS | Status: AC
  Administered 2015-07-21: 4 mg
  Filled 2015-07-21: qty 250

## 2015-07-21 MED ORDER — NOREPINEPHRINE 4 MG/250ML-% IV SOLN
INTRAVENOUS | Status: AC
  Administered 2015-07-21: 04:00:00
  Filled 2015-07-21: qty 250

## 2015-07-21 MED ORDER — DOPAMINE-DEXTROSE 3.2-5 MG/ML-% IV SOLN
0.0000 ug/kg/min | INTRAVENOUS | Status: DC
  Administered 2015-07-21: 20 ug/kg/min via INTRAVENOUS

## 2015-07-21 MED ORDER — CHLORHEXIDINE GLUCONATE 0.12% ORAL RINSE (MEDLINE KIT)
15.0000 mL | Freq: Two times a day (BID) | OROMUCOSAL | Status: DC
  Filled 2015-07-21 (×3): qty 15

## 2015-07-21 MED ORDER — ANTISEPTIC ORAL RINSE SOLUTION (CORINZ)
7.0000 mL | Freq: Four times a day (QID) | OROMUCOSAL | Status: DC
  Administered 2015-07-21: 7 mL via OROMUCOSAL
  Filled 2015-07-21 (×5): qty 7

## 2015-07-21 MED ORDER — NOREPINEPHRINE BITARTRATE 1 MG/ML IV SOLN
0.0000 ug/min | INTRAVENOUS | Status: DC
  Administered 2015-07-21: 60 ug/min via INTRAVENOUS
  Filled 2015-07-21: qty 16

## 2015-07-21 MED ORDER — SODIUM BICARBONATE 8.4 % IV SOLN
INTRAVENOUS | Status: DC
  Administered 2015-07-21: 06:00:00 via INTRAVENOUS
  Filled 2015-07-21 (×3): qty 850

## 2015-07-21 MED FILL — Medication: Qty: 1 | Status: AC

## 2015-07-22 LAB — BLOOD GAS, ARTERIAL
FIO2: 1
MECHVT: 350 mL
Mechanical Rate: 20
PATIENT TEMPERATURE: 37
PCO2 ART: 67 mmHg — AB (ref 32.0–48.0)
PEEP/CPAP: 5 cmH2O
PO2 ART: 72 mmHg — AB (ref 83.0–108.0)
pH, Arterial: 6.85 — CL (ref 7.350–7.450)

## 2015-07-22 LAB — SURGICAL PATHOLOGY

## 2015-07-23 NOTE — Progress Notes (Addendum)
Pt HR brady atropine given ,the patient then asystole. Code initiated with active CPR and meds. Pt coded for 20 min. Family at bedside. Time of death 63. Dr. Mortimer Fries and Dr. Lucky Cowboy notified. House supervisor present. Chaplin at bedside with family grieving appropriately.

## 2015-07-23 NOTE — Progress Notes (Signed)
Code blue was called in since pt was noted to be unresponsive. Code blue attended and run by ed physician Dr. Owens Shark for 20 + min. Pt received atropine x 3 doses, epi x 4 doses and bicarb. Intubated orally. Pulse obtained. SBP 80 mm Hg.  Pt transferred to icu for further management. Stat chest xray ordered. Dopamine drip in process Chart reviewed, pt had multiple co-morbid medical conditions including htn, copd, cad, pad. Contacted eicu attending Dr. Mortimer Fries, case discussed with him. Levophed ordered by Dr. Mortimer Fries. Case signed out to eicu attending Dr. Mortimer Fries. Discussed with pts wife about the events, his critical condition. Explained to her about possibility for further cardio- respiratory adverse events including further cardiac arrest. Pts wife wants to continue with aggressive measures at this time.

## 2015-07-23 NOTE — Progress Notes (Signed)
CODS notified and hold placed on body at present#07/12/2015-022. Per Marybelle Killings.

## 2015-07-23 NOTE — Progress Notes (Addendum)
CNA was in room washing pt up and called out saying this RN needed to come to room. Pt was laid back, eyes rolled to back of head, and no breathing. Called a code.  Started cpr and cpr team came to assist. Pt intubated and tx to ICU with a b/p of 86/63. Wife notified of change in status and to come as soon as possible and Dr.Dew notified as well

## 2015-07-23 NOTE — Discharge Summary (Signed)
   [facility]    Discharge Summary    Patient ID:  Aaron HURRELL Sr. MRN: [id number] DOB/AGE: March 09, 1946 70 y.o.  Admit date: 07/06/2015 Discharge date: 07/22/2015 Date of Surgery: 07/14/2015 Surgeon: Surgeon(s): Algernon Huxley, MD  Admission Diagnosis: ASO W  ULCERATION  Discharge Diagnoses:  ASO W  ULCERATION  Secondary Diagnoses: Past Medical History  Diagnosis Date  . Hypertension   . 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 CHF (congestive heart failure) ([place])   . COPD (chronic obstructive pulmonary disease) ([place])   . Peripheral vascular disease ([place])   . Venous insufficiency   . Carotid artery disease without cerebral infarction ([place])   . Atherosclerosis   . Syncope   . Myocardial infarction ([place])   . Hyperlipidemia   . Hypothyroidism     Procedure(s): TRANSMETATARSAL AMPUTATION  Discharged Condition: deceased  HPI:  Patient underwent a TMA amputation on 07/08/2015.  He was admitted overnight for observation.  About 3 am, a code blue occurred and the patient was initially resuscitated but expired a few hours later.  Hospital Course:  Aaron BATHGATE Sr. is a 70 y.o. male is S/P right Procedure(s): TRANSMETATARSAL AMPUTATION Extubated: in OR Physical exam:  Complications: patient expired  Consults:     Significant Diagnostic Studies: CBC Lab Results  Component Value Date   WBC 8.0 07-28-15   HGB 12.0* 07-28-2015   HCT 38.1* 2015-07-28   MCV 95.7 07-28-15   PLT 63* 07/28/15    BMET    Component Value Date/Time   NA 145 07/28/15 0505   NA 139 03/26/2014 0425   K 3.8 28-Jul-2015 0505   K 4.5 03/26/2014 0425   CL 104 28-Jul-2015 0505   CL 102 03/26/2014 0425   CO2 25 July 28, 2015 0505   CO2 30 03/26/2014 0425   GLUCOSE 119* 2015-07-28 0505   GLUCOSE 85 03/26/2014 0425   BUN 37* 07/28/2015 0505   BUN 24* 03/26/2014 0425   CREATININE 2.05* Jul 28, 2015 0505   CREATININE 1.30  03/26/2014 0425   CALCIUM 7.9* Jul 28, 2015 0505   CALCIUM 8.1* 03/26/2014 0425   GFRNONAA 31* Jul 28, 2015 0505   GFRNONAA 58* 03/26/2014 0425   GFRAA 36* 07-28-2015 0505   GFRAA >60 03/26/2014 0425   COAG Lab Results  Component Value Date   INR 2.46 07/12/2015   INR 1.1 03/26/2014   INR 1.0 02/06/2014     Disposition:  Discharge to :morgue      Signed: Leotis Pain, MD  07/22/2015, 9:25 AM

## 2015-07-23 NOTE — Progress Notes (Signed)
Patient arrived to unit post cardiac code . Pt arrived intubated and was placed on full vent support at 100%, on dopamine . Pt received 8 amps of bicarb and started on bicarb drip . NGT placed to LIS, foley in place . 2 PIV. Pt HR SR. Pt extremities blue and mottled, pupils #5 fixed and dilated. Unresponsive . Further assesment via flowsheet

## 2015-07-23 NOTE — Progress Notes (Signed)
eLink Physician-Brief Progress Note Patient Name: Aaron BECHARD Sr. DOB: March 13, 1946 MRN: [id number]   Date of Service  2015-07-22  HPI/Events of Note  S/p cardiac arrest and acute resp failure, patient with COPD/CHF/severe vasc disease approx 25 min cardiac arrest aystole:this is unfavorable arrest scenario-likely acute MI and cardiogenic shock  eICU Interventions  1.vent support, ABG pending 2.vasopressors added, 3.labs ordered 4.start bicarb drip        [name] 07-22-15, 3:07 AM

## 2015-07-23 DEATH — deceased

## 2015-07-26 LAB — CULTURE, BLOOD (ROUTINE X 2)
CULTURE: NO GROWTH
Culture: NO GROWTH

## 2017-05-09 IMAGING — CT CT CHEST W/ CM
1 of 2 series · 13 of 31 positions shown, 17 images · IV contrast (omnipaque)
Comparison: Multiple exams, including 05/22/2007

CLINICAL DATA: Weight loss. Tobacco use. Prior chest wall sarcoma
removed in 2113.

EXAM:
CT CHEST WITH CONTRAST
TECHNIQUE: Multidetector CT imaging of the chest was performed during
intravenous contrast administration.
CONTRAST:  75mL OMNIPAQUE IOHEXOL 300 MG/ML  SOLN

[Series 2: routine chest with · axial · 0.66mm/px · z∈[-44,+271]mm · 13 of 75 slices shown, 17 images]
[im 6/75  mediastinal]
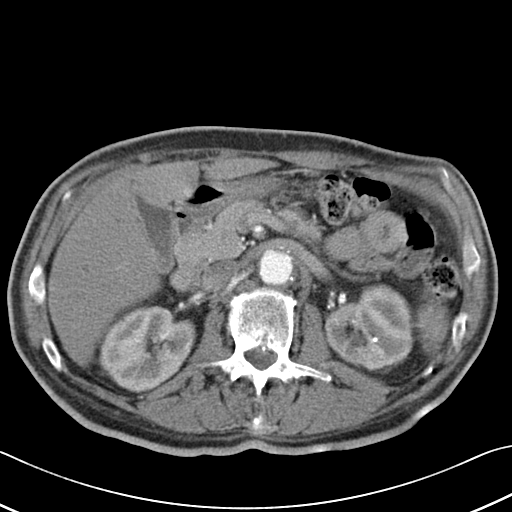
[im 6/75  lung]
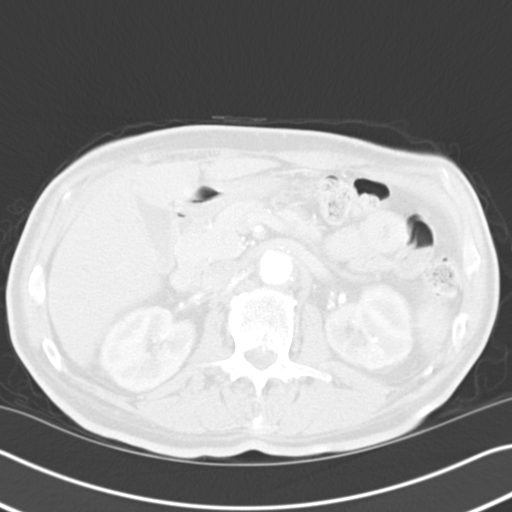
[im 12/75  lung]
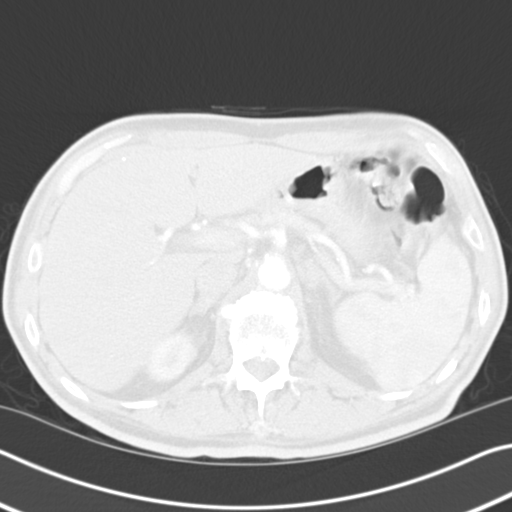
[im 18/75  lung]
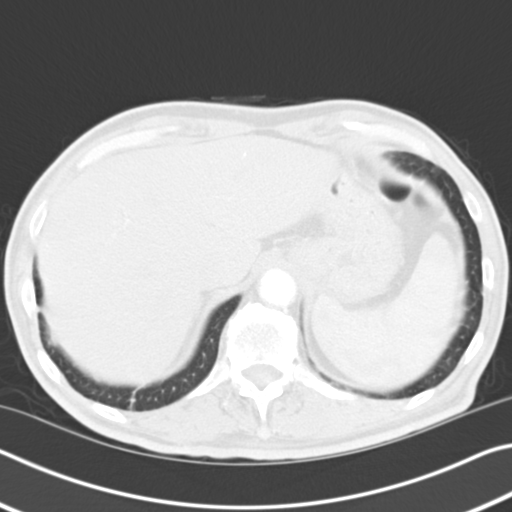
[im 23/75  lung]
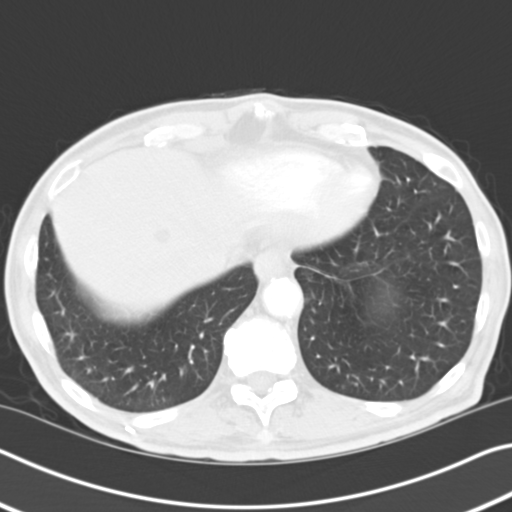
[im 29/75  mediastinal]
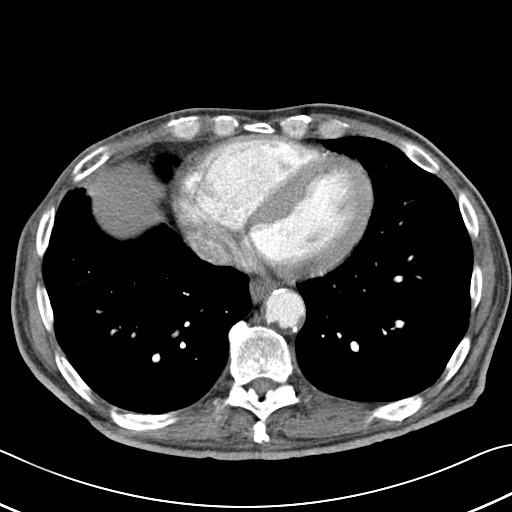
[im 29/75  lung]
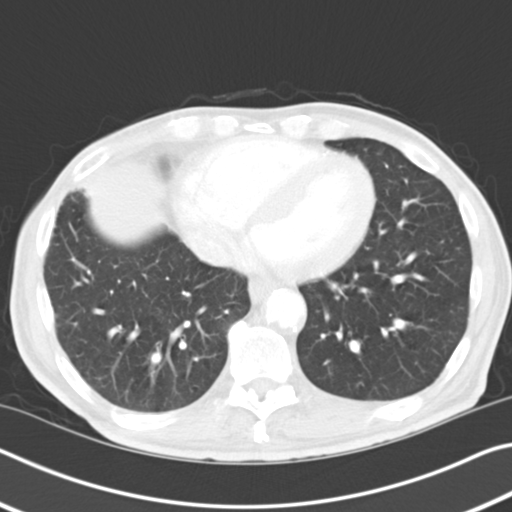
[im 35/75  lung]
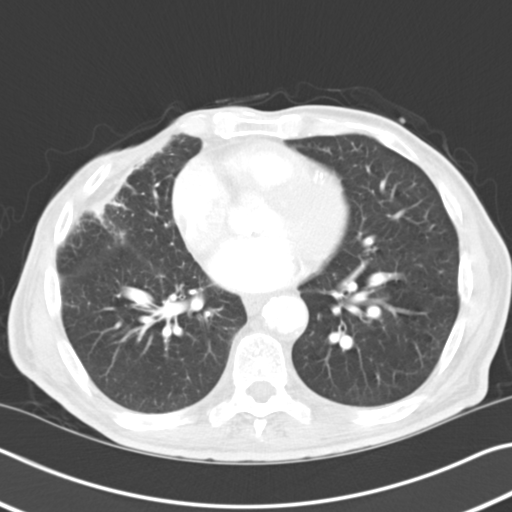
[im 38/75  lung]
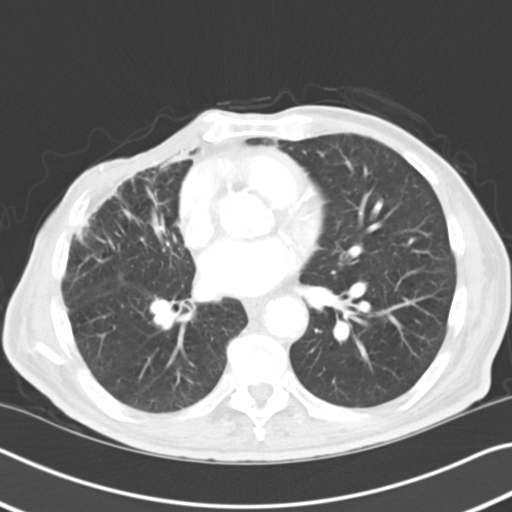
[im 40/75  lung]
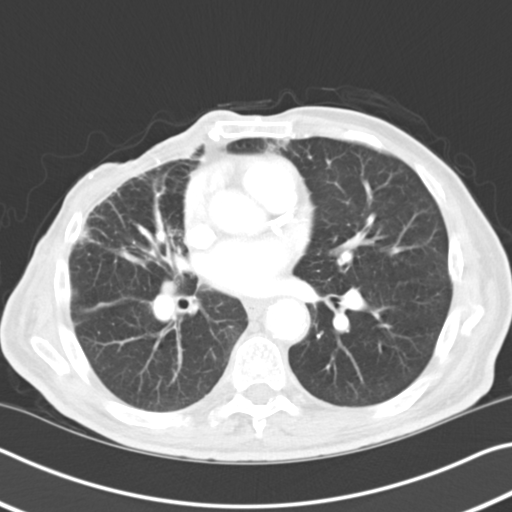
[im 46/75  mediastinal]
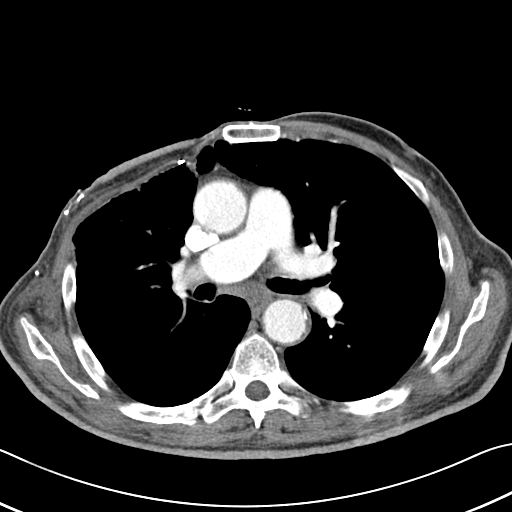
[im 46/75  lung]
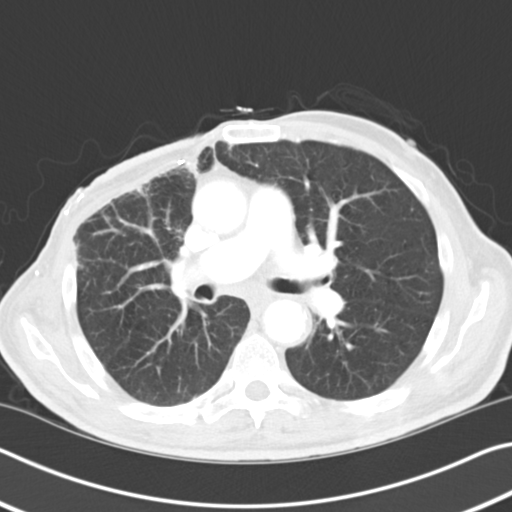
[im 52/75  lung]
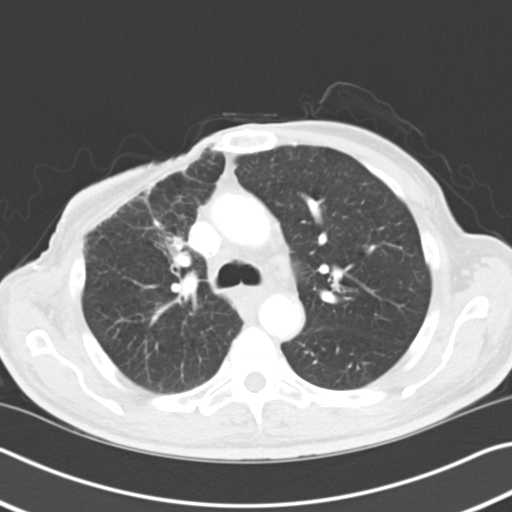
[im 57/75  lung]
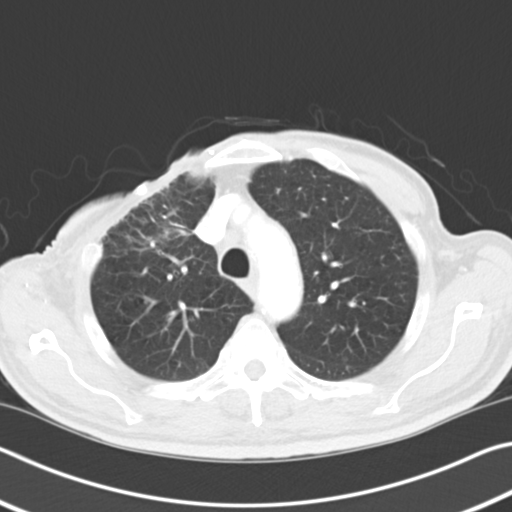
[im 63/75  lung]
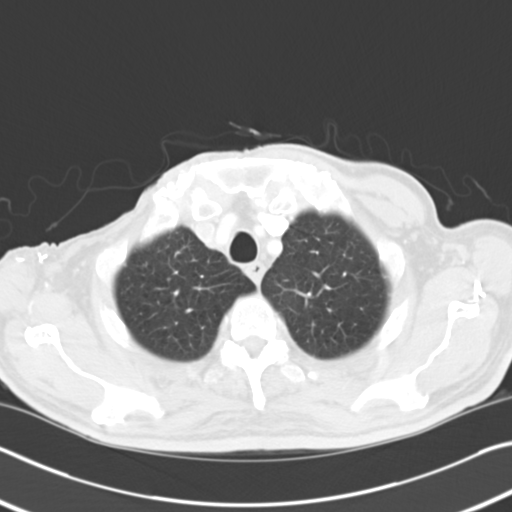
[im 69/75  mediastinal]
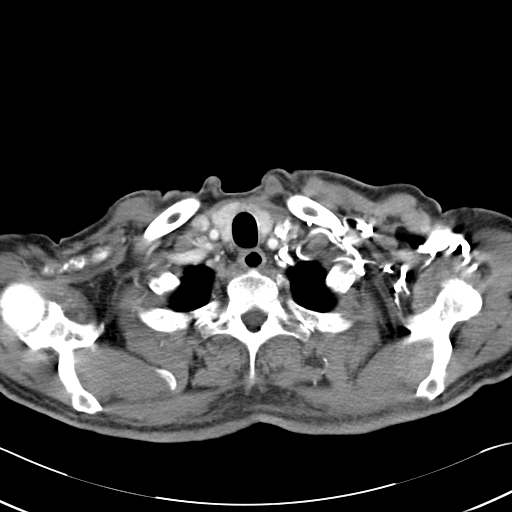
[im 69/75  lung]
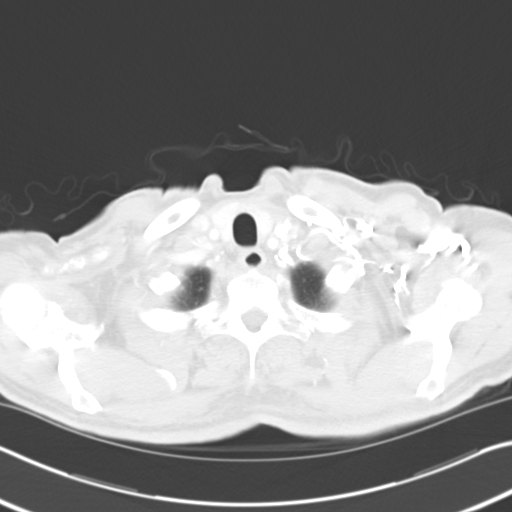

[13 of 31 positions shown; findings below may reference images not displayed]

FINDINGS: Mediastinum/Nodes: Coronary, aortic arch, and branch vessel
atherosclerotic vascular disease. Aortic and mitral valve
calcification observed.

Right paratracheal node 1 cm in short axis, image 18 series 2
(formerly the same). Additional stable upper normal AP window,
subcarinal, and right hilar lymph nodes.

Lungs/Pleura: Centrilobular emphysema. Anterior resections the right
second, third, and fourth ribs with underlying scarring in the right
upper lobe and right middle lobe similar to prior. Subsegmental
atelectasis in the posterior basal segment right lower lobe.

Pleural-based nodule in the right lower lobe 0.5 by 0.7 cm on image
50 series 3 (formerly 0.4 by 0.7 cm).

Bilateral airway thickening. Mucus is present in the right mainstem
bronchus and bronchus intermedius.

Chronic minimal nodularity noted in the superior segment left lower
lobe, lack of change from 3996 is reassuring against malignancy.

Upper abdomen: Suspected hepatic cyst on image 53 series 2. Punctate
calcifications are observed in the liver and there is mild
gallbladder wall thickening which may be due to contraction. The top
of an infrarenal abdominal aortic aneurysm is visualized, luminal
measurement 3.3 cm on the bottom most image also on this bottom most
image, in the left para-aortic high space there appears to be a
cm in short axis lymph node (image 75, series 2).

Vascular calcifications are present in both renal hila. I also
suspect bilateral nephrolithiasis.

Musculoskeletal: Bilateral subcutaneous edema, increased from prior.
Reduction in subcutaneous adipose tissue correlates with the
patient's weight loss. I do not see a local recurrence of the
patient's right thoracic mass. There is a suggestion of foraminal
narrowing at some levels in the thorax, especially on the left at
T3-4 and T4-5 and on the right at T3-4, due to facet arthropathy.
IMPRESSION: 1. New moderately enlarged left periaortic lymph node on the
bottom-most image. Although potentially reactive and not entirely
specific, the possibility of malignant adenopathy of the abdomen is
not excluded. CT abdomen/pelvis with contrast, or nuclear medicine
PET-CT could be utilized for further characterization.
2. Suspected small infrarenal abdominal aortic aneurysm, not fully
included on today' s exam.
3. Stable mild right paratracheal adenopathy. Like change from 3996
argues that this is probably benign.
4. Coronary, aortic arch, and branch vessel atherosclerotic vascular
disease. Aortic and mitral valve calcifications noted.
5. Centrilobular emphysema.
6. Prior right upper anterior rib resections. No recurrent thoracic
wall mass identified.
7. Chronically stable and benign pleural-based nodule in the right
lower lobe.
8. Airway thickening is present, suggesting bronchitis or reactive
airways disease. There is also mucus in the right tracheobronchial
tree.
9. Bilateral subcutaneous edema, increased from prior, indicating
some third spacing of fluid.
10. Facet arthropathy causes upper thoracic foraminal stenosis at
several levels.

## 2017-10-17 IMAGING — CR DG CHEST 2V
1 series · 2 of 2 positions shown · non-contrast
Comparison: CT 11/16/2014

CLINICAL DATA: Patient states weakness, chest pain, with sob.
Patient states he is a smoker , with CHF and LUng CA

EXAM:
CHEST  2 VIEW

[Series 1: dg chest 2 view · 0.14mm/px · 2 of 2 slices shown]
[im 1/2]
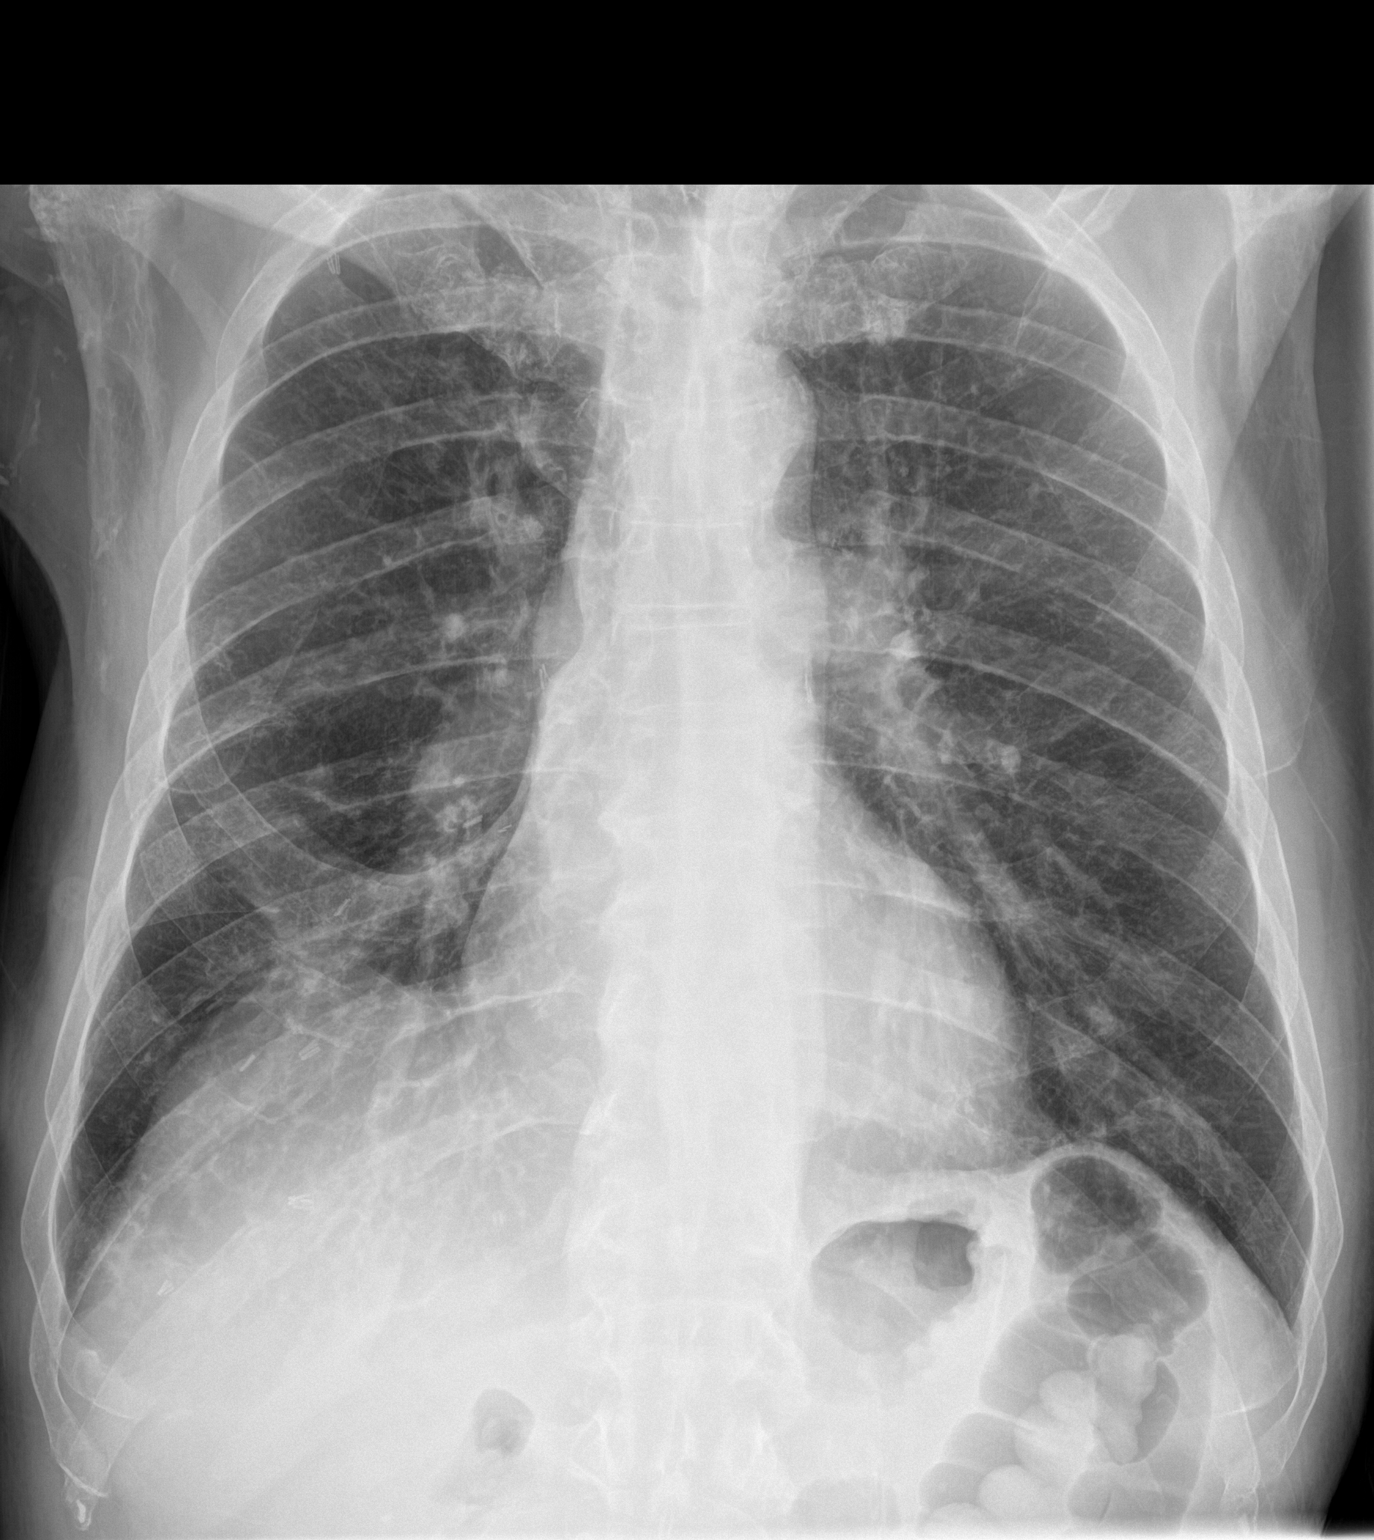
[im 2/2]
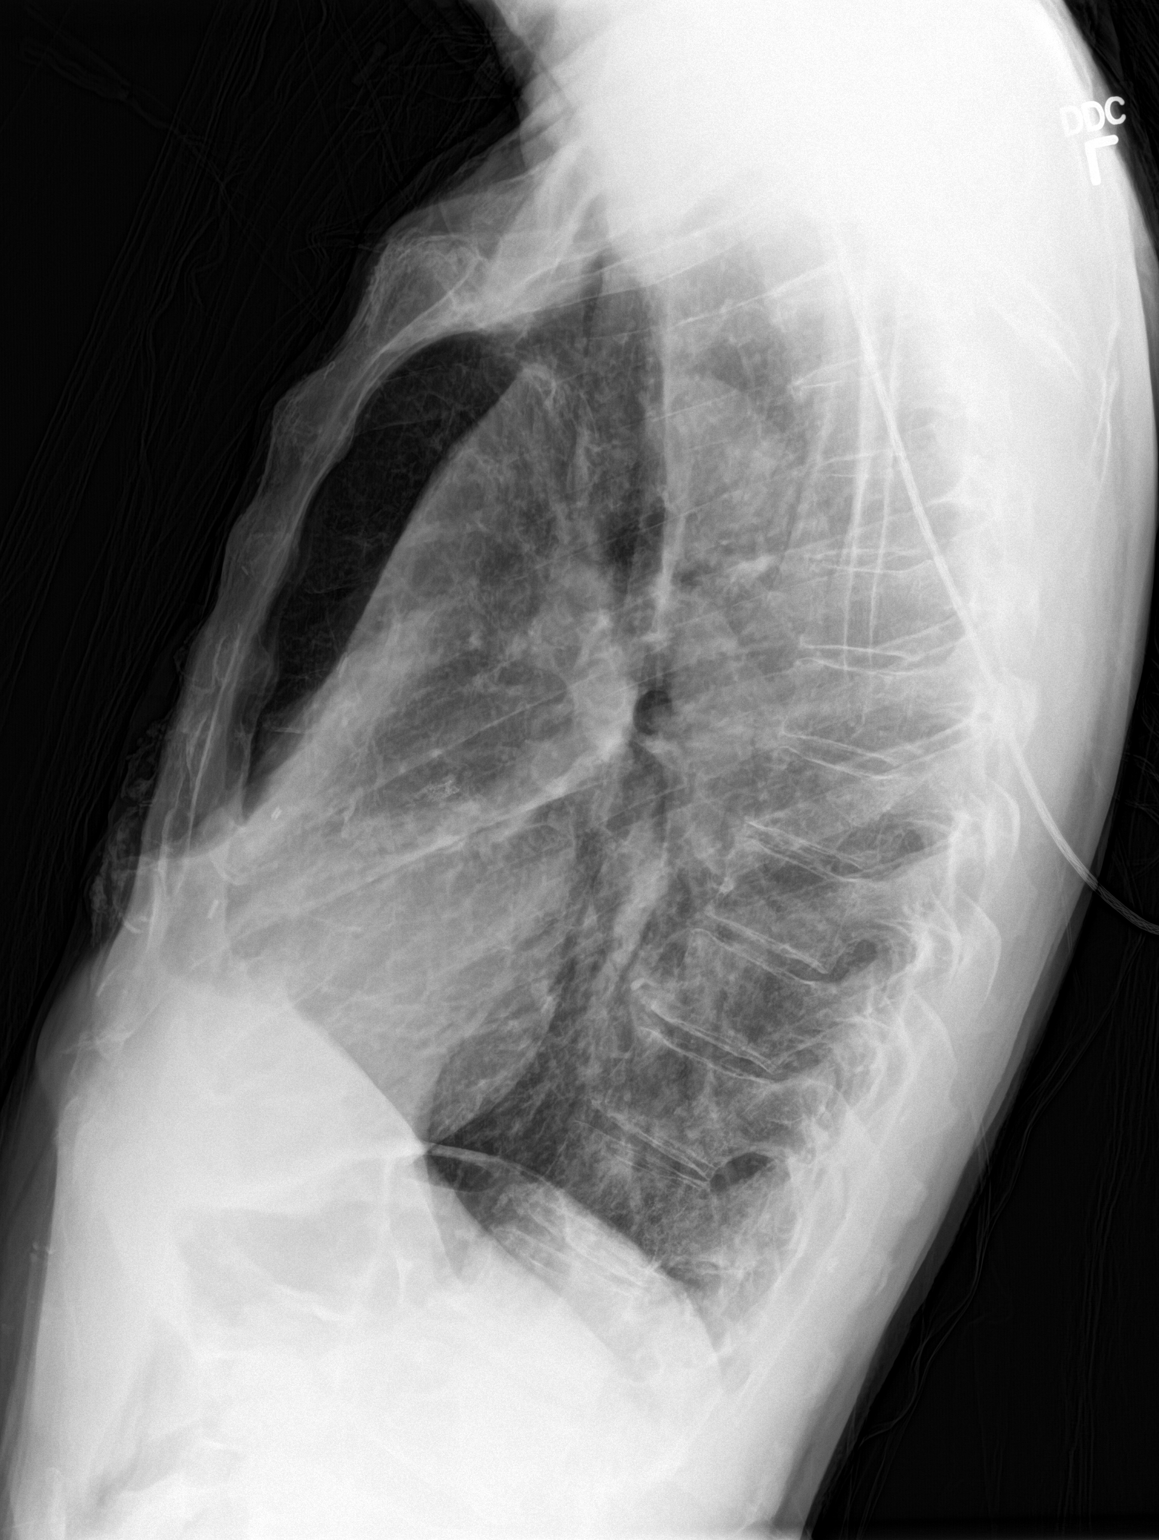

[2 of 2 positions shown; findings below may reference images not displayed]

FINDINGS: Normal cardiac silhouette noted. There is postsurgical change in the
RIGHT hemi thorax with multiple rib resections anteriorly. There is
scarring at the RIGHT lung base. No effusion, infiltrate, or
pneumothorax.
IMPRESSION: 1. Postsurgical change in the RIGHT hemi thorax with thoracotomy and
rib resections anteriorly.
2. No clear acute cardiopulmonary findings.

## 2018-01-11 IMAGING — DX DG ABD PORTABLE 1V
1 series · 1 of 1 positions shown · non-contrast
Comparison: CT of the abdomen and pelvis performed 03/31/2015

CLINICAL DATA: Nasogastric tube placement.  Initial encounter.

EXAM:
PORTABLE ABDOMEN - 1 VIEW

[abdomen kub]
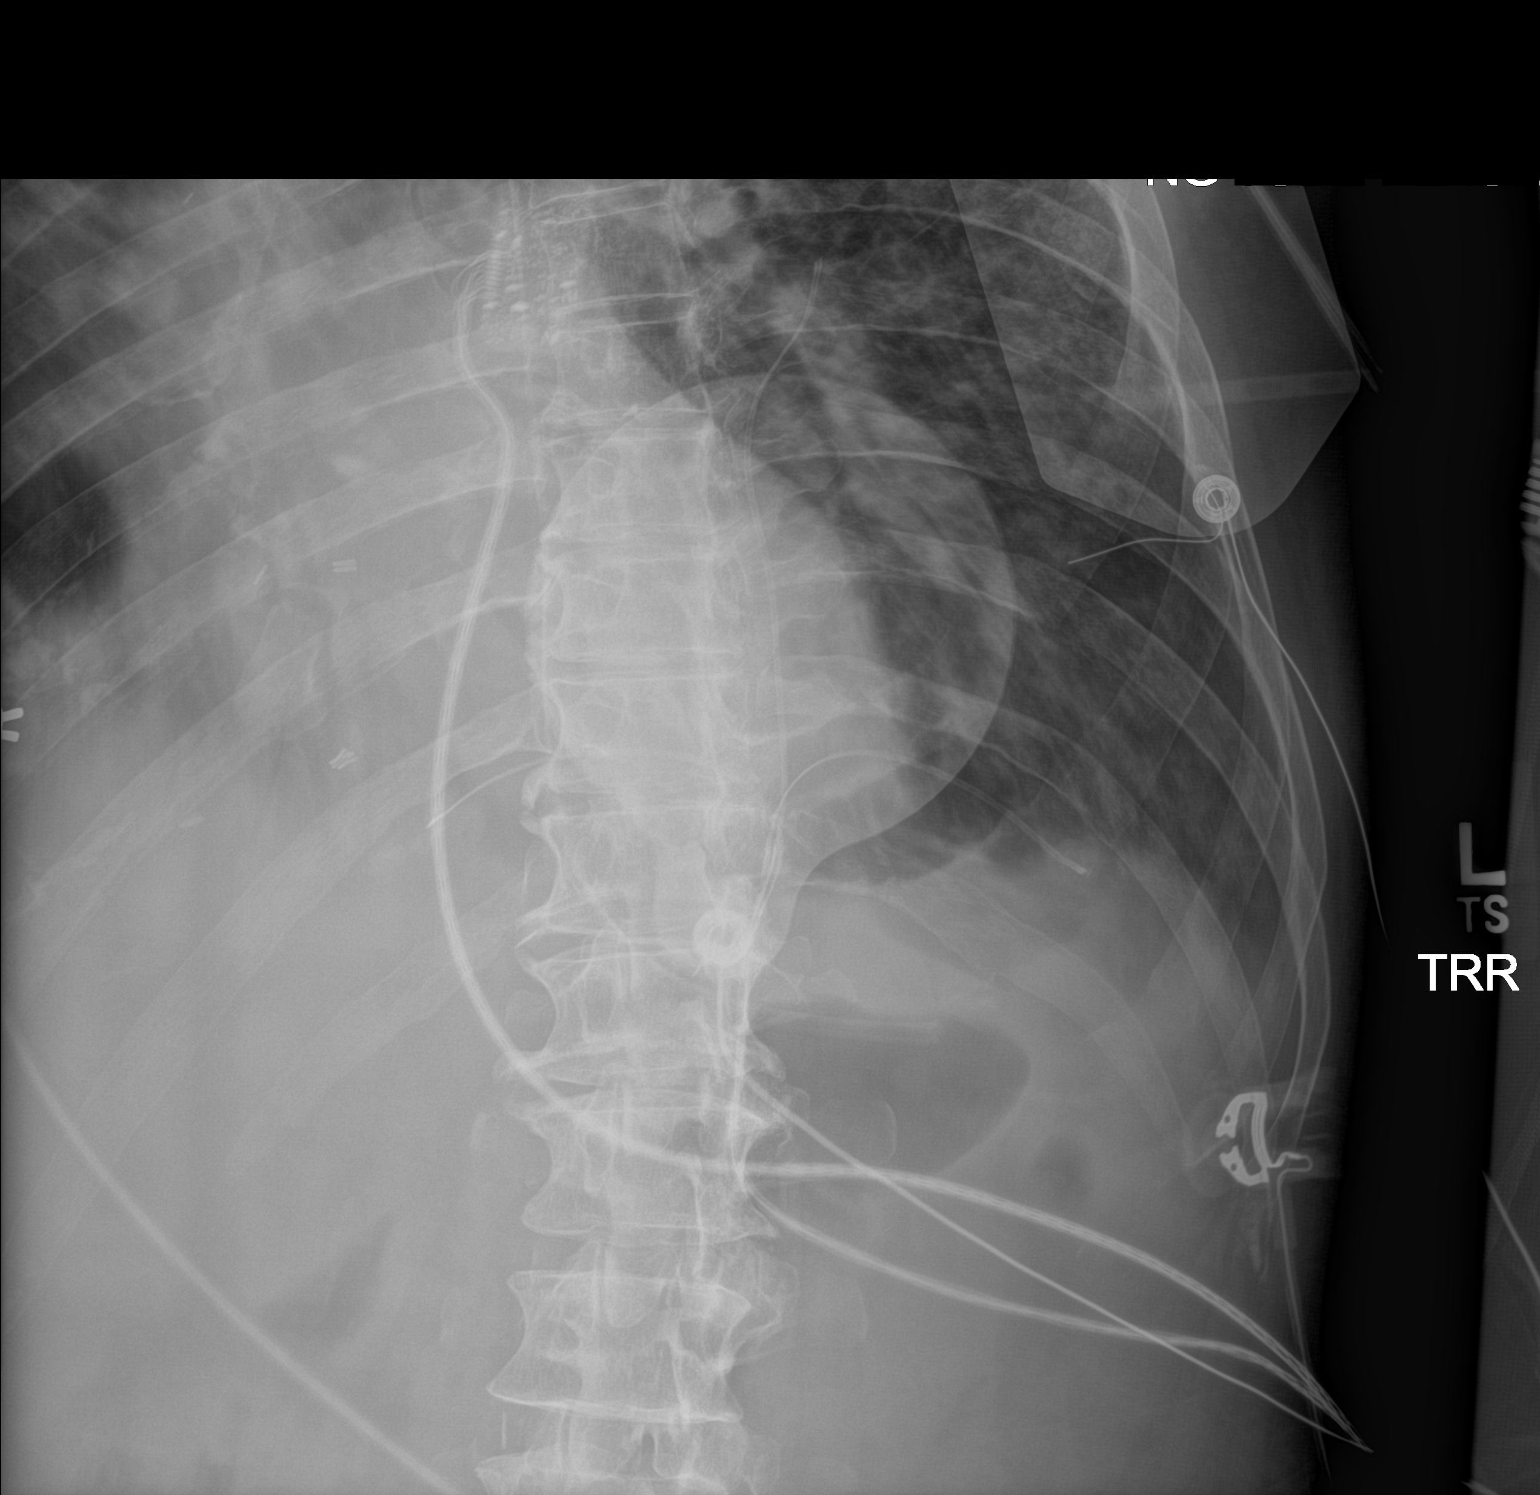

[1 of 1 positions shown; findings below may reference images not displayed]

FINDINGS: The patient's enteric tube is noted ending overlying the body of the
stomach.

The visualized bowel gas pattern is grossly unremarkable. Patchy
bilateral airspace opacities may reflect pulmonary edema or
pneumonia. No acute osseous abnormalities are seen. An external
pacing pad is noted.
IMPRESSION: 1. Enteric tube noted ending overlying the body of the stomach.
2. Patchy bilateral airspace opacities may reflect pulmonary edema
or pneumonia.
